# Patient Record
Sex: Male | Born: 1960 | Race: Asian | Marital: Married | Smoking: Never smoker
Health system: Southern US, Community
[De-identification: ages and names within clinical notes are randomized; demographics above are authoritative.]

---

## 2021-03-12 ENCOUNTER — Encounter (HOSPITAL_COMMUNITY): Payer: Self-pay | Admitting: Emergency Medicine

## 2021-03-12 ENCOUNTER — Inpatient Hospital Stay (HOSPITAL_COMMUNITY): Payer: Self-pay

## 2021-03-12 ENCOUNTER — Other Ambulatory Visit: Payer: Self-pay

## 2021-03-12 ENCOUNTER — Emergency Department (HOSPITAL_COMMUNITY): Payer: Self-pay

## 2021-03-12 ENCOUNTER — Inpatient Hospital Stay (HOSPITAL_COMMUNITY)
Admission: EM | Admit: 2021-03-12 | Discharge: 2021-03-26 | DRG: 023 | Disposition: E | Payer: Self-pay | Attending: Neurology | Admitting: Neurology

## 2021-03-12 DIAGNOSIS — I610 Nontraumatic intracerebral hemorrhage in hemisphere, subcortical: Secondary | ICD-10-CM | POA: Diagnosis present

## 2021-03-12 DIAGNOSIS — I615 Nontraumatic intracerebral hemorrhage, intraventricular: Principal | ICD-10-CM | POA: Diagnosis present

## 2021-03-12 DIAGNOSIS — Z7982 Long term (current) use of aspirin: Secondary | ICD-10-CM

## 2021-03-12 DIAGNOSIS — R0902 Hypoxemia: Secondary | ICD-10-CM

## 2021-03-12 DIAGNOSIS — R4701 Aphasia: Secondary | ICD-10-CM | POA: Diagnosis present

## 2021-03-12 DIAGNOSIS — G935 Compression of brain: Secondary | ICD-10-CM | POA: Diagnosis present

## 2021-03-12 DIAGNOSIS — I161 Hypertensive emergency: Secondary | ICD-10-CM | POA: Diagnosis present

## 2021-03-12 DIAGNOSIS — Z7902 Long term (current) use of antithrombotics/antiplatelets: Secondary | ICD-10-CM

## 2021-03-12 DIAGNOSIS — Z978 Presence of other specified devices: Secondary | ICD-10-CM

## 2021-03-12 DIAGNOSIS — H5500 Unspecified nystagmus: Secondary | ICD-10-CM | POA: Diagnosis present

## 2021-03-12 DIAGNOSIS — I639 Cerebral infarction, unspecified: Secondary | ICD-10-CM | POA: Diagnosis present

## 2021-03-12 DIAGNOSIS — I10 Essential (primary) hypertension: Secondary | ICD-10-CM | POA: Diagnosis present

## 2021-03-12 DIAGNOSIS — G9389 Other specified disorders of brain: Secondary | ICD-10-CM | POA: Diagnosis present

## 2021-03-12 DIAGNOSIS — G8191 Hemiplegia, unspecified affecting right dominant side: Secondary | ICD-10-CM | POA: Diagnosis present

## 2021-03-12 DIAGNOSIS — R9082 White matter disease, unspecified: Secondary | ICD-10-CM | POA: Diagnosis present

## 2021-03-12 DIAGNOSIS — E781 Pure hyperglyceridemia: Secondary | ICD-10-CM | POA: Diagnosis present

## 2021-03-12 DIAGNOSIS — D72829 Elevated white blood cell count, unspecified: Secondary | ICD-10-CM | POA: Diagnosis present

## 2021-03-12 DIAGNOSIS — R29810 Facial weakness: Secondary | ICD-10-CM | POA: Diagnosis present

## 2021-03-12 DIAGNOSIS — I629 Nontraumatic intracranial hemorrhage, unspecified: Secondary | ICD-10-CM

## 2021-03-12 DIAGNOSIS — Z9289 Personal history of other medical treatment: Secondary | ICD-10-CM

## 2021-03-12 DIAGNOSIS — E870 Hyperosmolality and hypernatremia: Secondary | ICD-10-CM | POA: Diagnosis not present

## 2021-03-12 DIAGNOSIS — J9601 Acute respiratory failure with hypoxia: Secondary | ICD-10-CM | POA: Diagnosis not present

## 2021-03-12 DIAGNOSIS — E785 Hyperlipidemia, unspecified: Secondary | ICD-10-CM | POA: Diagnosis present

## 2021-03-12 DIAGNOSIS — G936 Cerebral edema: Secondary | ICD-10-CM | POA: Diagnosis present

## 2021-03-12 DIAGNOSIS — Z4659 Encounter for fitting and adjustment of other gastrointestinal appliance and device: Secondary | ICD-10-CM

## 2021-03-12 LAB — I-STAT CHEM 8, ED
BUN: 12 mg/dL (ref 6–20)
Calcium, Ion: 1.05 mmol/L — ABNORMAL LOW (ref 1.15–1.40)
Chloride: 101 mmol/L (ref 98–111)
Creatinine, Ser: 0.9 mg/dL (ref 0.61–1.24)
Glucose, Bld: 121 mg/dL — ABNORMAL HIGH (ref 70–99)
HCT: 37 % — ABNORMAL LOW (ref 39.0–52.0)
Hemoglobin: 12.6 g/dL — ABNORMAL LOW (ref 13.0–17.0)
Potassium: 3.7 mmol/L (ref 3.5–5.1)
Sodium: 136 mmol/L (ref 135–145)
TCO2: 23 mmol/L (ref 22–32)

## 2021-03-12 LAB — APTT
aPTT: 33 seconds (ref 24–36)
aPTT: 30 seconds (ref 24–36)

## 2021-03-12 LAB — URINALYSIS, COMPLETE (UACMP) WITH MICROSCOPIC
Bacteria, UA: NONE SEEN
Bilirubin Urine: NEGATIVE
Glucose, UA: 150 mg/dL — AB
Hgb urine dipstick: NEGATIVE
Ketones, ur: 5 mg/dL — AB
Leukocytes,Ua: NEGATIVE
Nitrite: NEGATIVE
Protein, ur: NEGATIVE mg/dL
Specific Gravity, Urine: 1.008 (ref 1.005–1.030)
pH: 7 (ref 5.0–8.0)

## 2021-03-12 LAB — COMPREHENSIVE METABOLIC PANEL
ALT: 9 U/L (ref 0–44)
AST: 23 U/L (ref 15–41)
Albumin: 3.7 g/dL (ref 3.5–5.0)
Alkaline Phosphatase: 16 U/L — ABNORMAL LOW (ref 38–126)
Anion gap: 10 (ref 5–15)
BUN: 11 mg/dL (ref 6–20)
CO2: 21 mmol/L — ABNORMAL LOW (ref 22–32)
Calcium: 8.9 mg/dL (ref 8.9–10.3)
Chloride: 103 mmol/L (ref 98–111)
Creatinine, Ser: 0.93 mg/dL (ref 0.61–1.24)
GFR, Estimated: >60 mL/min (ref >60)
Glucose, Bld: 125 mg/dL — ABNORMAL HIGH (ref 70–99)
Potassium: 3.8 mmol/L (ref 3.5–5.1)
Sodium: 134 mmol/L — ABNORMAL LOW (ref 135–145)
Total Bilirubin: 0.7 mg/dL (ref 0.3–1.2)
Total Protein: 7.2 g/dL (ref 6.5–8.1)

## 2021-03-12 LAB — DIFFERENTIAL
Abs Immature Granulocytes: 0.03 10*3/uL (ref 0.00–0.07)
Basophils Absolute: 0.1 10*3/uL (ref 0.0–0.1)
Basophils Relative: 1 %
Eosinophils Absolute: 1.1 10*3/uL — ABNORMAL HIGH (ref 0.0–0.5)
Eosinophils Relative: 12 %
Immature Granulocytes: 0 %
Lymphocytes Relative: 31 %
Lymphs Abs: 2.8 10*3/uL (ref 0.7–4.0)
Monocytes Absolute: 0.8 10*3/uL (ref 0.1–1.0)
Monocytes Relative: 9 %
Neutro Abs: 4.3 10*3/uL (ref 1.7–7.7)
Neutrophils Relative %: 47 %

## 2021-03-12 LAB — CBC
HCT: 38.4 % — ABNORMAL LOW (ref 39.0–52.0)
HCT: 39 % (ref 39.0–52.0)
Hemoglobin: 12.4 g/dL — ABNORMAL LOW (ref 13.0–17.0)
Hemoglobin: 13 g/dL (ref 13.0–17.0)
MCH: 27.6 pg (ref 26.0–34.0)
MCH: 27.7 pg (ref 26.0–34.0)
MCHC: 32.3 g/dL (ref 30.0–36.0)
MCHC: 33.3 g/dL (ref 30.0–36.0)
MCV: 85.3 fL (ref 80.0–100.0)
MCV: 83.2 fL (ref 80.0–100.0)
Platelets: 389 10*3/uL (ref 150–400)
Platelets: 413 10*3/uL — ABNORMAL HIGH (ref 150–400)
RBC: 4.5 MIL/uL (ref 4.22–5.81)
RBC: 4.69 MIL/uL (ref 4.22–5.81)
RDW: 13.2 % (ref 11.5–15.5)
RDW: 13.2 % (ref 11.5–15.5)
WBC: 9 10*3/uL (ref 4.0–10.5)
WBC: 14.8 10*3/uL — ABNORMAL HIGH (ref 4.0–10.5)
nRBC: 0 % (ref 0.0–0.2)
nRBC: 0 % (ref 0.0–0.2)

## 2021-03-12 LAB — RAPID URINE DRUG SCREEN, HOSP PERFORMED
Amphetamines: NOT DETECTED
Barbiturates: NOT DETECTED
Benzodiazepines: NOT DETECTED
Cocaine: NOT DETECTED
Opiates: NOT DETECTED
Tetrahydrocannabinol: NOT DETECTED

## 2021-03-12 LAB — PROTIME-INR
INR: 1 (ref 0.8–1.2)
INR: 1 (ref 0.8–1.2)
Prothrombin Time: 13.6 seconds (ref 11.4–15.2)
Prothrombin Time: 13.7 seconds (ref 11.4–15.2)

## 2021-03-12 LAB — LIPID PANEL
Cholesterol: 127 mg/dL (ref 0–200)
HDL: 45 mg/dL (ref >40)
LDL Cholesterol: 54 mg/dL (ref 0–99)
Total CHOL/HDL Ratio: 2.8 RATIO
Triglycerides: 138 mg/dL (ref <150)
VLDL: 28 mg/dL (ref 0–40)

## 2021-03-12 LAB — SODIUM
Sodium: 134 mmol/L — ABNORMAL LOW (ref 135–145)
Sodium: 135 mmol/L (ref 135–145)

## 2021-03-12 LAB — HIV ANTIBODY (ROUTINE TESTING W REFLEX): HIV Screen 4th Generation wRfx: NONREACTIVE

## 2021-03-12 LAB — MRSA NEXT GEN BY PCR, NASAL: MRSA by PCR Next Gen: DETECTED — AB

## 2021-03-12 LAB — HEMOGLOBIN A1C
Hgb A1c MFr Bld: 6 % — ABNORMAL HIGH (ref 4.8–5.6)
Mean Plasma Glucose: 125.5 mg/dL

## 2021-03-12 LAB — CBG MONITORING, ED: Glucose-Capillary: 128 mg/dL — ABNORMAL HIGH (ref 70–99)

## 2021-03-12 LAB — ETHANOL: Alcohol, Ethyl (B): <10 mg/dL (ref <10)

## 2021-03-12 MED ORDER — LABETALOL HCL 5 MG/ML IV SOLN
20.0000 mg | Freq: Once | INTRAVENOUS | Status: AC
  Administered 2021-03-12: 20 mg via INTRAVENOUS

## 2021-03-12 MED ORDER — ACETAMINOPHEN 160 MG/5ML PO SOLN
650.0000 mg | ORAL | Status: DC | PRN
  Administered 2021-03-13 – 2021-03-15 (×6): 650 mg
  Filled 2021-03-12 (×7): qty 20.3

## 2021-03-12 MED ORDER — ORAL CARE MOUTH RINSE
15.0000 mL | Freq: Two times a day (BID) | OROMUCOSAL | Status: DC
  Administered 2021-03-13 – 2021-03-14 (×3): 15 mL via OROMUCOSAL

## 2021-03-12 MED ORDER — CHLORHEXIDINE GLUCONATE 0.12 % MT SOLN
15.0000 mL | Freq: Two times a day (BID) | OROMUCOSAL | Status: DC
  Administered 2021-03-12 – 2021-03-13 (×3): 15 mL via OROMUCOSAL
  Filled 2021-03-12: qty 15

## 2021-03-12 MED ORDER — ACETAMINOPHEN 650 MG RE SUPP
650.0000 mg | RECTAL | Status: DC | PRN
  Administered 2021-03-13: 650 mg via RECTAL
  Filled 2021-03-12 (×2): qty 1

## 2021-03-12 MED ORDER — ONDANSETRON HCL 4 MG/2ML IJ SOLN
4.0000 mg | Freq: Four times a day (QID) | INTRAMUSCULAR | Status: DC | PRN
  Administered 2021-03-12: 4 mg via INTRAVENOUS

## 2021-03-12 MED ORDER — SODIUM CHLORIDE 3 % IV SOLN
INTRAVENOUS | Status: DC
  Administered 2021-03-12: 50 mL/h via INTRAVENOUS
  Filled 2021-03-12 (×9): qty 500

## 2021-03-12 MED ORDER — LABETALOL HCL 5 MG/ML IV SOLN
INTRAVENOUS | Status: AC
  Filled 2021-03-12: qty 4

## 2021-03-12 MED ORDER — HYDRALAZINE HCL 20 MG/ML IJ SOLN: 10.0000 mg | Freq: Four times a day (QID) | INTRAMUSCULAR | Status: DC | PRN

## 2021-03-12 MED ORDER — LABETALOL HCL 5 MG/ML IV SOLN: 20.0000 mg | Freq: Once | INTRAVENOUS | Status: DC

## 2021-03-12 MED ORDER — SENNOSIDES-DOCUSATE SODIUM 8.6-50 MG PO TABS
1.0000 | ORAL_TABLET | Freq: Two times a day (BID) | ORAL | Status: DC
  Filled 2021-03-12: qty 1

## 2021-03-12 MED ORDER — CHLORHEXIDINE GLUCONATE CLOTH 2 % EX PADS
6.0000 | MEDICATED_PAD | Freq: Every day | CUTANEOUS | Status: DC
  Administered 2021-03-13 – 2021-03-15 (×3): 6 via TOPICAL

## 2021-03-12 MED ORDER — SODIUM CHLORIDE 0.9 % IV SOLN: 12.5000 mg | Freq: Four times a day (QID) | INTRAVENOUS | Status: DC | PRN

## 2021-03-12 MED ORDER — MUPIROCIN 2 % EX OINT
1.0000 "application " | TOPICAL_OINTMENT | Freq: Two times a day (BID) | CUTANEOUS | Status: DC
  Administered 2021-03-12 – 2021-03-15 (×5): 1 via NASAL
  Filled 2021-03-12: qty 22

## 2021-03-12 MED ORDER — ONDANSETRON HCL 4 MG/2ML IJ SOLN
INTRAMUSCULAR | Status: AC
  Filled 2021-03-12: qty 2

## 2021-03-12 MED ORDER — LABETALOL HCL 5 MG/ML IV SOLN
10.0000 mg | Freq: Once | INTRAVENOUS | Status: AC
  Administered 2021-03-12: 10 mg via INTRAVENOUS
  Filled 2021-03-12: qty 4

## 2021-03-12 MED ORDER — CHLORHEXIDINE GLUCONATE CLOTH 2 % EX PADS
6.0000 | MEDICATED_PAD | Freq: Every day | CUTANEOUS | Status: DC
  Administered 2021-03-12: 6 via TOPICAL

## 2021-03-12 MED ORDER — PANTOPRAZOLE SODIUM 40 MG IV SOLR
40.0000 mg | Freq: Every day | INTRAVENOUS | Status: DC
Start: 2021-03-12 — End: 2021-03-15
  Administered 2021-03-12 – 2021-03-14 (×3): 40 mg via INTRAVENOUS
  Filled 2021-03-12 (×3): qty 40

## 2021-03-12 MED ORDER — STROKE: EARLY STAGES OF RECOVERY BOOK: Freq: Once | Status: DC

## 2021-03-12 MED ORDER — CLEVIDIPINE BUTYRATE 0.5 MG/ML IV EMUL: 0.0000 mg/h | INTRAVENOUS | Status: DC

## 2021-03-12 MED ORDER — CLEVIDIPINE BUTYRATE 0.5 MG/ML IV EMUL
0.0000 mg/h | INTRAVENOUS | Status: DC
  Administered 2021-03-12: 14 mg/h via INTRAVENOUS
  Administered 2021-03-12: 30 mg/h via INTRAVENOUS
  Administered 2021-03-12: 22 mg/h via INTRAVENOUS
  Administered 2021-03-12: 28 mg/h via INTRAVENOUS
  Administered 2021-03-12: 1 mg/h via INTRAVENOUS
  Administered 2021-03-12: 12 mg/h via INTRAVENOUS
  Administered 2021-03-13: 20 mg/h via INTRAVENOUS
  Administered 2021-03-13 (×2): 18 mg/h via INTRAVENOUS
  Administered 2021-03-13: 14 mg/h via INTRAVENOUS
  Administered 2021-03-13: 10 mg/h via INTRAVENOUS
  Administered 2021-03-14 (×2): 28 mg/h via INTRAVENOUS
  Administered 2021-03-14 (×3): 32 mg/h via INTRAVENOUS
  Administered 2021-03-14: 16 mg/h via INTRAVENOUS
  Administered 2021-03-14: 18 mg/h via INTRAVENOUS
  Administered 2021-03-14: 26 mg/h via INTRAVENOUS
  Administered 2021-03-15: 32 mg/h via INTRAVENOUS
  Administered 2021-03-15: 28 mg/h via INTRAVENOUS
  Administered 2021-03-15 (×5): 32 mg/h via INTRAVENOUS
  Filled 2021-03-12: qty 50
  Filled 2021-03-12: qty 100
  Filled 2021-03-12 (×2): qty 50
  Filled 2021-03-12 (×2): qty 100
  Filled 2021-03-12: qty 50
  Filled 2021-03-12 (×2): qty 100
  Filled 2021-03-12: qty 200
  Filled 2021-03-12: qty 100
  Filled 2021-03-12: qty 50
  Filled 2021-03-12: qty 100
  Filled 2021-03-12: qty 300
  Filled 2021-03-12: qty 200
  Filled 2021-03-12: qty 50
  Filled 2021-03-12: qty 200
  Filled 2021-03-12 (×3): qty 100
  Filled 2021-03-12: qty 300
  Filled 2021-03-12: qty 100

## 2021-03-12 MED ORDER — ACETAMINOPHEN 325 MG PO TABS: 650.0000 mg | ORAL_TABLET | ORAL | Status: DC | PRN

## 2021-03-12 NOTE — Consult Note (Addendum)
NAME:  Brendan Burgess, MRN:  [id number], DOB:  [birth year], LOS: 0 ADMISSION DATE:  03/13/2021, CONSULTATION DATE: 03/13/2021 REFERRING MD: Dr. Arbutus Ped, CHIEF COMPLAINT:  Basal ganglia hemorrhagic stroke    History of Present Illness:  Brendan Burgess is a 60 y.o. male with a PMH significant for prior CVA on ASA and HTN who presented to the ED for code stroke due to acute onset aphasia, left gaze, and right sided weakness with facial droop. LKN 0915.   On ED arrival patient was taken emergently to CT where a 4.7 x 6.1 x 2.4cm left basal ganglia haemorrhagia was seen. Since admission (once in ED and once in ICU) patient vomited. Concern for airway protection prompted PCCM consult for further assistance in management.   Pertinent  Medical History  Prior CVA on ASA HTN   Significant Hospital Events:   10/18 admitted as code stroke with acute onset aphasia, left gaze, and right sided weakness with facial droop. LKN 0915. Vomited twice PCCM consulted for possible need of airway management   Interim History / Subjective:  As above   Objective   Blood pressure (!) 153/94, pulse 72, temperature (!) 96.6 F (35.9 C), temperature source Axillary, resp. rate 17, weight 63.8 kg, SpO2 98 %.        Intake/Output Summary (Last 24 hours) at 03/17/2021 1319 Last data filed at 03/17/2021 1226 Gross per 24 hour  Intake 15.62 ml  Output --  Net 15.62 ml   Filed Weights   03/11/2021 1100  Weight: 63.8 kg    Examination: General: Well developed middle aged male lying in be in NAD HEENT: [place]/AT, MM pink/moist, PERRL,  Neuro: Unresponsive with left gaze  CV: s1s2 regular rate and rhythm, no murmur, rubs, or gallops,  PULM:  Snoring respirations that resolve with chin thrust, oxygen saturations 94-98 on RA GI: soft, bowel sounds active in all 4 quadrants, non-tender, non-distended Extremities: warm/dry, no edema  Skin: no rashes or lesions  Resolved Hospital Problem list     Assessment & Plan:  Large  left basal ganglia hemorrhage with associated mass effect -CT on admit 10/18 revealed a 4.7 x 6.1 x 2.4cm left basal ganglia haemorrhagia was seen.  P: Close monitoring in the ICU Primary management per neurology  Maintain neuro protective measures; goal for eurothermia, euglycemia, eunatermia, normoxia, and PCO2 goal of 35-40 Nutrition and bowel regiment  Seizure precautions  Aspirations precautions  Further imagine per neurro  Hypertonic saline   Hypertensive emergency  -BP 174/133 on admit  Hx of HTN  -Working with family to determine home medication P: Continue Cleviprex for SBP less than 140 Continuous telemetry  PRN IV Hydralazine    At risk for respiratory compromise  -Given decreased mentation in the setting of ICH patient is at risk for inability to protect his airway.  P: Close monitoring of airway HOB elevated  Oral and or NTS suctioning as needed  Minimize sedation  Ensure adequate pulmonary hygiene    Best Practice    Diet/type: NPO DVT prophylaxis: SCD GI prophylaxis: PPI Lines: N/A Foley:  N/A Code Status:  full code Last date of multidisciplinary goals of care discussion: Pending   Labs   CBC: Recent Labs  Lab 03/04/2021 1125 03/20/2021 1131  WBC 9.0  --   NEUTROABS 4.3  --   HGB 12.4* 12.6*  HCT 38.4* 37.0*  MCV 85.3  --   PLT 389  --     Basic Metabolic Panel: Recent Labs  Lab 03/19/2021  1125 04-08-21 1131  NA 134* 136  K 3.8 3.7  CL 103 101  CO2 21*  --   GLUCOSE 125* 121*  BUN 11 12  CREATININE 0.93 0.90  CALCIUM 8.9  --    GFR: CrCl cannot be calculated (Unknown ideal weight.). Recent Labs  Lab 04/08/2021 1125  WBC 9.0    Liver Function Tests: Recent Labs  Lab April 08, 2021 1125  AST 23  ALT 9  ALKPHOS 16*  BILITOT 0.7  PROT 7.2  ALBUMIN 3.7   No results for input(s): LIPASE, AMYLASE in the last 168 hours. No results for input(s): AMMONIA in the last 168 hours.  ABG    Component Value Date/Time   TCO2 23 04-08-2021  1131     Coagulation Profile: Recent Labs  Lab Apr 08, 2021 1125  INR 1.0    Cardiac Enzymes: No results for input(s): CKTOTAL, CKMB, CKMBINDEX, TROPONINI in the last 168 hours.  HbA1C: No results found for: HGBA1C  CBG: Recent Labs  Lab 04-08-21 1124  GLUCAP 128*    Review of Systems:   Unable to assess   Past Medical History:  He,  has no past medical history on file.   Surgical History:  History reviewed. No pertinent surgical history.   Social History:      Family History:  His family history is not on file.   Allergies Not on File   Home Medications  Prior to Admission medications   Not on File     Critical care time: NA  Giles Currie D. Tiburcio Pea, NP-C [facility] Personal contact information can be found on Amion  04-08-21, 1:49 PM

## 2021-03-12 NOTE — Progress Notes (Addendum)
1300:  Patient transferred from ED to 4NICU.  Initial BP above blood pressure goal.  Cleviprex titrated to max and PRN given. Patient had large vomit.  Neurology MD paged and made aware of blood pressure and vomit.  New orders received.  CCM consulted per neurology and is at bedside assessing patient.    1500:  CT completed.  CCM and Neurology MD aware.

## 2021-03-12 NOTE — ED Triage Notes (Signed)
Pt arrived by EMS. Code stroke was called Pt is vietnamese speaking, translator used for intal interview  Pt aphasic and not following commands, left gaze.

## 2021-03-12 NOTE — Code Documentation (Signed)
Stroke Response Nurse Documentation Code Documentation  Brendan Burgess is a 60 y.o. male arriving to [facility] ED via [facility] on 03/09/2021 with past medical hx of CVA (11 years ago in [place]), HTN. On aspirin 81 mg daily. Code stroke was activated by [facility].   Patient from home where he was LKW at 0915 and now complaining of aphasia, left gaze, right sided weakness, facial droop. [name] brought pt and his wife home from errands at 45 and pt was normal in his independent state. [name] called son back to house when pt was not responding to her. EMS called.  Stroke team at the bedside on patient arrival. Labs drawn and patient cleared for CT by EDP. Patient to CT with team. NIHSS 31, see documentation for details and code stroke times. Patient with decreased LOC, disoriented, not following commands, left gaze preference , right hemianopia, left facial droop, right arm weakness, bilateral leg weakness, bilateral decreased sensation, Global aphasia , dysarthria , and Visual  neglect on exam. The following imaging was completed:  CT. Patient is not a candidate for IV Thrombolytic due to ICH seen on exam per MD. Patient is not a candidate for IR due to no LVO. Labetalol given twice and then Cleviprex gtt started. Pt vomited once in ED and then again in ICU. PRNs ordered and given. Neurologist consulted CCM for medical management of airway in case of decline.   Care/Plan: Q1 VS/mNIHSS/pupils. BP<140, hypertonic saline.   Bedside handoff with ED RN Annice Pih and then TK on 4N.    Demaris Leavell, [place]  Stroke Response RN

## 2021-03-12 NOTE — Progress Notes (Signed)
60 yo with left basal ganglia hypertensive hemorrhage.  No indication for neurosurgical intervention.  Rec admit to neurology/CCM for supportive care

## 2021-03-12 NOTE — ED Provider Notes (Addendum)
[facility] EMERGENCY DEPARTMENT Provider Note   CSN: [id number] Arrival date & time: 02/23/2021  1123  An emergency department physician performed an initial assessment on this suspected stroke patient at 1121.  History Chief Complaint  Patient presents with   Code Stroke    Brendan Burgess is a 60 y.o. male   Level 5 caveat altered mental status.  All history obtained from EMS personnel.  60 year old male history of hypertension otherwise healthy last known well around 9:15 AM today.  Patient was dropped off at his house by his son, received a call a few minutes later that the patient was acting abnormally.  EMS found patient at home with aphasia, leftward gaze preference and right hemiplegia.  Code stroke was activated patient was met at the bridge by ER physician as well as neurology, Dr. Cheral Marker.  He was taken directly to the CT scanner and found to have intracranial hemorrhage.  Patient was noted to be hypertensive, labetalol as well as hypertonic saline has been ordered by neurology.   HPI     History reviewed. No pertinent past medical history.  Patient Active Problem List   Diagnosis Date Noted   Stroke (cerebrum) ([place]) 03/05/2021    History reviewed. No pertinent surgical history.     History reviewed. No pertinent family history.     Home Medications Prior to Admission medications   Not on File    Allergies    Patient has no allergy information on record.  Review of Systems   Review of Systems  Unable to perform ROS: Mental status change   Physical Exam Updated Vital Signs BP 117/76   Pulse 68   Temp (!) 96.6 F (35.9 C) (Axillary)   Resp (!) 21   Wt 63.8 kg   SpO2 93%   Physical Exam Constitutional:      Appearance: He is well-developed and normal weight. He is not toxic-appearing.  HENT:     Head: Normocephalic. No raccoon eyes or Battle's sign.  Eyes:     General: Lids are normal.     Conjunctiva/sclera: Conjunctivae normal.      Comments: Leftward gaze  Neck:     Trachea: Trachea normal.  Pulmonary:     Effort: Pulmonary effort is normal. No accessory muscle usage or respiratory distress.     Breath sounds: Normal air entry.  Abdominal:     General: Abdomen is flat.     Palpations: Abdomen is soft.  Musculoskeletal:     Cervical back: Neck supple.  Skin:    General: Skin is warm and dry.  Neurological:     Mental Status: He is lethargic.  Psychiatric:        Speech: He is noncommunicative.    ED Results / Procedures / Treatments   Labs (all labs ordered are listed, but only abnormal results are displayed) Labs Reviewed  CBC - Abnormal; Notable for the following components:      Result Value   Hemoglobin 12.4 (*)    HCT 38.4 (*)    All other components within normal limits  DIFFERENTIAL - Abnormal; Notable for the following components:   Eosinophils Absolute 1.1 (*)    All other components within normal limits  COMPREHENSIVE METABOLIC PANEL - Abnormal; Notable for the following components:   Sodium 134 (*)    CO2 21 (*)    Glucose, Bld 125 (*)    Alkaline Phosphatase 16 (*)    All other components within normal limits  I-STAT  CHEM 8, ED - Abnormal; Notable for the following components:   Glucose, Bld 121 (*)    Calcium, Ion 1.05 (*)    Hemoglobin 12.6 (*)    HCT 37.0 (*)    All other components within normal limits  CBG MONITORING, ED - Abnormal; Notable for the following components:   Glucose-Capillary 128 (*)    All other components within normal limits  MRSA NEXT GEN BY PCR, NASAL  PROTIME-INR  APTT  HIV ANTIBODY (ROUTINE TESTING W REFLEX)  CBC  PROTIME-INR  APTT  LIPID PANEL  ETHANOL  HEMOGLOBIN A1C  URINALYSIS, COMPLETE (UACMP) WITH MICROSCOPIC  RAPID URINE DRUG SCREEN, HOSP PERFORMED  SODIUM  SODIUM  SODIUM    EKG None  Radiology CT HEAD CODE STROKE WO CONTRAST  Result Date: 03/22/2021 CLINICAL DATA:  Code stroke.  Left-sided gaze. EXAM: CT HEAD WITHOUT CONTRAST  TECHNIQUE: Contiguous axial images were obtained from the base of the skull through the vertex without intravenous contrast. COMPARISON:  None. FINDINGS: Brain: A left basal ganglia and external capsule hemorrhage measures 4.7 x 6.1 x 2.4 cm. Minimal amount subarachnoid hemorrhage is present on the left. No definite intraventricular hemorrhage is present. No hydrocephalus is present. 3 mm of midline shift is present. Mass effect effaces the left lateral ventricle. There is effacement of the sulci over the left convexity. Moderate diffuse white matter disease is present. Remote nonhemorrhagic lacunar infarcts are present in the right thalamus. Brainstem and cerebellum are within normal limits. Vascular: Atherosclerotic calcifications are present within the cavernous internal carotid arteries. No hyperdense vessel is present. Skull: Calvarium is intact. No focal lytic or blastic lesions are present. Left supraorbital scalp soft tissue swelling is present without underlying fracture. Sinuses/Orbits: The paranasal sinuses and mastoid air cells are clear. Bilateral lens replacements are noted. Globes and orbits are otherwise unremarkable. IMPRESSION: 1. 4.7 x 6.1 x 2.4 cm left basal ganglia and external capsule hemorrhage. 2. Minimal amount of subarachnoid hemorrhage on the left. 3. Mass effect effaces the left lateral ventricle with 3 mm of midline shift. 4. Left supraorbital scalp soft tissue swelling without underlying fracture. 5. Moderate diffuse white matter disease likely reflects the sequela of chronic microvascular ischemia. The above was relayed via text pager to Dr. Cheral Marker on 03/10/2021 at 11:36 . Electronically Signed   By: San Morelle M.D.   On: 03/08/2021 11:41    Procedures .Critical Care Performed by: Deliah Boston, PA-C Authorized by: Deliah Boston, PA-C   Critical care provider statement:    Critical care time (minutes):  31   Critical care was necessary to treat or prevent  imminent or life-threatening deterioration of the following conditions:  CNS failure or compromise   Critical care was time spent personally by me on the following activities:  Development of treatment plan with patient or surrogate, discussions with consultants, examination of patient, obtaining history from patient or surrogate, review of old charts, pulse oximetry, re-evaluation of patient's condition, ordering and review of radiographic studies and ordering and review of laboratory studies   Medications Ordered in ED Medications  clevidipine (CLEVIPREX) infusion 0.5 mg/mL (30 mg/hr Intravenous New Bag/Given 03/10/2021 1248)  ondansetron (ZOFRAN) injection 4 mg (4 mg Intravenous Given 03/23/2021 1157)  ondansetron (ZOFRAN) 4 MG/2ML injection (  Not Given 03/25/2021 1200)   stroke: mapping our early stages of recovery book (has no administration in time range)  acetaminophen (TYLENOL) tablet 650 mg (has no administration in time range)    Or  acetaminophen (  TYLENOL) 160 MG/5ML solution 650 mg (has no administration in time range)    Or  acetaminophen (TYLENOL) suppository 650 mg (has no administration in time range)  senna-docusate (Senokot-S) tablet 1 tablet (has no administration in time range)  pantoprazole (PROTONIX) injection 40 mg (has no administration in time range)  sodium chloride (hypertonic) 3 % solution (50 mL/hr Intravenous New Bag/Given 03/13/2021 1226)  labetalol (NORMODYNE) 5 MG/ML injection (has no administration in time range)  promethazine (PHENERGAN) 12.5 mg in sodium chloride 0.9 % 50 mL IVPB (has no administration in time range)  Chlorhexidine Gluconate Cloth 2 % PADS 6 each (has no administration in time range)  labetalol (NORMODYNE) injection 20 mg (20 mg Intravenous Given 03/10/2021 1135)  labetalol (NORMODYNE) injection 10 mg (10 mg Intravenous Given 03/09/2021 1301)    ED Course  I have reviewed the triage vital signs and the nursing notes.  Pertinent labs & imaging  results that were available during my care of the patient were reviewed by me and considered in my medical decision making (see chart for details).  Clinical Course as of 03/25/2021 1359  Tue Mar 12, 2021  1157 Neurosurgery Repaged [BM]    Clinical Course User Index [BM] Gari Crown   MDM Rules/Calculators/A&P                           Additional history obtained from: Nursing notes from this visit. Review of electronic medical records.  60 year old male history of hypertension otherwise healthy last known well around 9:15 AM today.  Patient was dropped off at his house by his son, received a call a few minutes later that the patient was acting abnormally.  EMS found patient at home with aphasia, leftward gaze preference and right hemiplegia.  Code stroke was activated patient was met at the bridge by ER physician as well as neurology, Dr. Cheral Marker.  He was taken directly to the CT scanner and found to have intracranial hemorrhage.  Patient was noted to be hypertensive, labetalol as well as hypertonic saline has been ordered by neurology.  I arrived to CT, Dr. Cheral Marker asked that I page neurosurgery for their input. On initial evaluation patient on CT table, airway intact with leftward gaze. ------ 11:57 AM: Received call from neurosurgery team spoke with RN who reported she was sitting with Dr. Trenton Gammon.  RN reports that Dr. Trenton Gammon reviewed images and this is not interpretable and they recommend neurology admission. ------ Patient arrived to room 25, stroke team RNs at bedside, I updated them on neurosurgery recommendations.  Cleviprex infusion being managed by stroke team. Awaiting further neurology recommendations.  On reevaluation vital signs are stable patient's airway is intact.  Stroke team RN reports patient had emesis earlier for which they gave Zofran. Patient's family at bedside. ------ Labs resulted, CMP without emergent electrolyte derangement, AKI or gap.  INR within normal  limits.  CBC shows mild anemia of 12.4, no leukocytosis or thrombocytopenia.  Glucose of 128.  I returned to room to reassess patient and update family, patient has been taken to the floor by neurology.  Note: Portions of this report may have been transcribed using voice recognition software. Every effort was made to ensure accuracy; however, inadvertent computerized transcription errors may still be present.  Final Clinical Impression(s) / ED Diagnoses Final diagnoses:  Intracranial hemorrhage ([place])    Rx / DC Orders ED Discharge Orders     None  Deliah Boston, PA-C 03/13/2021 1334    Deliah Boston, PA-C 03/18/2021 1335    Deliah Boston, PA-C 03/08/2021 [place], DO 03/13/21 [phone]

## 2021-03-12 NOTE — H&P (Addendum)
Admission H&P    Chief Complaint: Acute onset of right sided weakness  HPI: Brendan Burgess is an 60 y.o. Vietnamese-speaking male with a PMHx of HTN and stroke approximately 11 years ago while in [place] (had left sided weakness and required PT), presenting acutely from home via EMS as a Code Stroke after family noted him to be acutely nonverbal with right sided weakness. LKN was 0915 when the patient was dropped off at home by his son. Later this morning, he was with his wife when she noted him to acutely become aphasic with right sided weakness. EMS was called and on arrival to the patient's home, he continued to be weak on his right, with right facial droop, left gaze deviation and aphasia.   Of note, the patient recently arrived in the [place] from [place]. He   LSN: [id number]  PMHx Stroke approximately 11 years ago with left sided weakness HTN  History reviewed. No pertinent surgical history.  History reviewed. No pertinent family history. Social History:  has no history on file for tobacco use, alcohol use, and drug use.  Allergies: Not on File  Medications: None listed in Epic Family states he takes ASA daily On one or more BP medications Home Rx's per family are in [place] brought over from [place]  ROS: Unable to obtain due to AMS.   Physical Examination: Weight 63.8 kg.  HEENT-  [place]/AT  Lungs - Respirations unlabored Extremities - No edema  Neurologic Examination: Ment: Exam assisted by [place] interpreter. Awake with decreased level of alertness. Right hemineglect. Nonverbal to all questions given via [place] interpreter. Not following any commands, except for lifting of LUE and attempt to smile, which were performed after a significant delay.  CN: PERRL. No blink to threat. Eyes deviated to the left; cannot overcome with oculocephalic maneuver. Decreased responses to right sided stimuli. Right facial droop. Nonverbal. Head rotated preferentially to the left. Does not  protrude tongue to command.  Motor/Sensory: Moves LUE spontaneously and also will elevate antigravity to command, after a significant delay.  Withdraws LLE to noxious.  RUE: Flaccid tone with no movement to noxious.  RLE: Minimal movement to noxious.  Reflexes: 2+ bilateral brachioradialis and patellae. Right toe mute, left toe upgoing.  Cerebellar: Not following commands for testing.  Gait: Unable to assess  Results for orders placed or performed during the hospital encounter of 03/01/2021 (from the past 48 hour(s))  CBG monitoring, ED     Status: Abnormal   Collection Time: 03/23/2021 11:24 AM  Result Value Ref Range   Glucose-Capillary 128 (H) 70 - 99 mg/dL    Comment: Glucose reference range applies only to samples taken after fasting for at least 8 hours.  I-stat chem 8, ED     Status: Abnormal   Collection Time: 03/11/2021 11:31 AM  Result Value Ref Range   Sodium 136 135 - 145 mmol/L   Potassium 3.7 3.5 - 5.1 mmol/L   Chloride 101 98 - 111 mmol/L   BUN 12 6 - 20 mg/dL   Creatinine, Ser 1.09 0.61 - 1.24 mg/dL   Glucose, Bld 323 (H) 70 - 99 mg/dL    Comment: Glucose reference range applies only to samples taken after fasting for at least 8 hours.   Calcium, Ion 1.05 (L) 1.15 - 1.40 mmol/L   TCO2 23 22 - 32 mmol/L   Hemoglobin 12.6 (L) 13.0 - 17.0 g/dL   HCT 55.7 (L) 32.2 - 02.5 %   No results found.  Assessment: 60  y.o. male with a prior history of stroke, presenting with acute onset of right hemiplegia, right facial droop, aphasia and leftward gaze deviation. CT head reveals a large left basal ganglia hemorrhage with associated mass effect.  1. Exam findings best localize to the left cerebral hemisphere.  2. CT head: 4.7 x 6.1 x 2.4 cm left basal ganglia and external capsule hemorrhage. Minimal amount of subarachnoid hemorrhage on the left. Mass effect effaces the left lateral ventricle with 3 mm of midline shift. Left supraorbital scalp soft tissue swelling without underlying  fracture. Moderate diffuse white matter disease likely reflects the sequela of chronic microvascular ischemia. 3. Not on a blood thinner at home, per family. Takes daily ASA.    Plan: 1. Admit to ICU under Neurology service 2. MRI/MRA of head 3. Carotid ultrasound 4. TTE 5. PT consult, OT consult, Speech consult 6. Cardiac telemetry 7. Frequent neuro checks 8. Hypertonic saline 3% at 50 cc/hr 9. BP management with clevidipine drip. SBP goal of < 140 10. No antiplatelet medications or anticoagulants 11. Repeat CT head in 6 hours (ordered).  12. Family to bring in the patient's home medications. Instructed to call Neurology when the medications are available for documenting.  13. Neurosurgery has been consulted. At this point in time, they do not feel that the patient is a surgical candidate.   50 minutes spent in the emergent neurological evaluation and management of this critically ill patient.   Addendum: - Repeat CT head shows enlarging hematoma, now with extension into the left lateral ventricle, as well as increased mass effect and left to right midline shift - Neurosurgery has been called back by CCM. Per CCM, Neurosurgery has viewed the second CT scan and does not feel that surgical intervention benefits would outweigh risks - Continuing hypertonic saline - Repeat CT head in 6 hours.  - Neurology team to discuss goals of care and code status with the family.   Electronically signed: Dr. Caryl Pina 03/22/2021, 11:35 AM

## 2021-03-13 ENCOUNTER — Inpatient Hospital Stay (HOSPITAL_COMMUNITY): Payer: Self-pay

## 2021-03-13 DIAGNOSIS — Z8679 Personal history of other diseases of the circulatory system: Secondary | ICD-10-CM

## 2021-03-13 DIAGNOSIS — G936 Cerebral edema: Secondary | ICD-10-CM

## 2021-03-13 DIAGNOSIS — I161 Hypertensive emergency: Secondary | ICD-10-CM

## 2021-03-13 DIAGNOSIS — I6389 Other cerebral infarction: Secondary | ICD-10-CM

## 2021-03-13 DIAGNOSIS — D72829 Elevated white blood cell count, unspecified: Secondary | ICD-10-CM

## 2021-03-13 LAB — GLUCOSE, CAPILLARY
Glucose-Capillary: 125 mg/dL — ABNORMAL HIGH (ref 70–99)
Glucose-Capillary: 168 mg/dL — ABNORMAL HIGH (ref 70–99)
Glucose-Capillary: 168 mg/dL — ABNORMAL HIGH (ref 70–99)

## 2021-03-13 LAB — CBC
HCT: 43.5 % (ref 39.0–52.0)
Hemoglobin: 13.8 g/dL (ref 13.0–17.0)
MCH: 28.2 pg (ref 26.0–34.0)
MCHC: 31.7 g/dL (ref 30.0–36.0)
MCV: 89 fL (ref 80.0–100.0)
Platelets: 422 10*3/uL — ABNORMAL HIGH (ref 150–400)
RBC: 4.89 MIL/uL (ref 4.22–5.81)
RDW: 13.7 % (ref 11.5–15.5)
WBC: 9.6 10*3/uL (ref 4.0–10.5)
nRBC: 0 % (ref 0.0–0.2)

## 2021-03-13 LAB — MAGNESIUM
Magnesium: 2.1 mg/dL (ref 1.7–2.4)
Magnesium: 2.2 mg/dL (ref 1.7–2.4)

## 2021-03-13 LAB — BASIC METABOLIC PANEL
Anion gap: 12 (ref 5–15)
BUN: 8 mg/dL (ref 6–20)
CO2: 16 mmol/L — ABNORMAL LOW (ref 22–32)
Calcium: 8.7 mg/dL — ABNORMAL LOW (ref 8.9–10.3)
Chloride: 113 mmol/L — ABNORMAL HIGH (ref 98–111)
Creatinine, Ser: 0.83 mg/dL (ref 0.61–1.24)
GFR, Estimated: >60 mL/min (ref >60)
Glucose, Bld: 132 mg/dL — ABNORMAL HIGH (ref 70–99)
Potassium: 4.8 mmol/L (ref 3.5–5.1)
Sodium: 141 mmol/L (ref 135–145)

## 2021-03-13 LAB — PHOSPHORUS: Phosphorus: 2.5 mg/dL (ref 2.5–4.6)

## 2021-03-13 LAB — ECHOCARDIOGRAM COMPLETE
Area-P 1/2: 6.83 cm2
Calc EF: 62 %
S' Lateral: 2.8 cm
Single Plane A2C EF: 64.5 %
Single Plane A4C EF: 61.9 %
Weight: 2250.46 oz

## 2021-03-13 LAB — SODIUM
Sodium: 138 mmol/L (ref 135–145)
Sodium: 147 mmol/L — ABNORMAL HIGH (ref 135–145)

## 2021-03-13 MED ORDER — LABETALOL HCL 5 MG/ML IV SOLN
5.0000 mg | INTRAVENOUS | Status: DC | PRN
  Administered 2021-03-13 (×2): 20 mg via INTRAVENOUS
  Administered 2021-03-14: 10 mg via INTRAVENOUS
  Administered 2021-03-14: 20 mg via INTRAVENOUS
  Administered 2021-03-14: 10 mg via INTRAVENOUS
  Administered 2021-03-14 – 2021-03-15 (×2): 20 mg via INTRAVENOUS
  Filled 2021-03-13 (×9): qty 4

## 2021-03-13 MED ORDER — FENTANYL CITRATE PF 50 MCG/ML IJ SOSY
50.0000 ug | PREFILLED_SYRINGE | Freq: Once | INTRAMUSCULAR | Status: AC
  Administered 2021-03-13: 50 ug via INTRAVENOUS
  Filled 2021-03-13: qty 1

## 2021-03-13 MED ORDER — OSMOLITE 1.2 CAL PO LIQD
1000.0000 mL | ORAL | Status: DC
  Administered 2021-03-13 – 2021-03-15 (×4): 1000 mL

## 2021-03-13 MED ORDER — INSULIN ASPART 100 UNIT/ML IJ SOLN
0.0000 [IU] | INTRAMUSCULAR | Status: DC
  Administered 2021-03-13 – 2021-03-14 (×3): 3 [IU] via SUBCUTANEOUS
  Administered 2021-03-14: 2 [IU] via SUBCUTANEOUS
  Administered 2021-03-14: 3 [IU] via SUBCUTANEOUS
  Administered 2021-03-14 – 2021-03-15 (×2): 5 [IU] via SUBCUTANEOUS
  Administered 2021-03-15 (×2): 3 [IU] via SUBCUTANEOUS
  Administered 2021-03-15: 5 [IU] via SUBCUTANEOUS
  Administered 2021-03-15: 3 [IU] via SUBCUTANEOUS

## 2021-03-13 MED ORDER — HYDRALAZINE HCL 20 MG/ML IJ SOLN
10.0000 mg | Freq: Four times a day (QID) | INTRAMUSCULAR | Status: DC | PRN
  Administered 2021-03-13 – 2021-03-15 (×3): 10 mg via INTRAVENOUS
  Filled 2021-03-13 (×3): qty 1

## 2021-03-13 MED ORDER — PROSOURCE TF PO LIQD
45.0000 mL | Freq: Every day | ORAL | Status: DC
  Administered 2021-03-13 – 2021-03-15 (×2): 45 mL
  Filled 2021-03-13: qty 45

## 2021-03-13 MED ORDER — SENNOSIDES-DOCUSATE SODIUM 8.6-50 MG PO TABS
1.0000 | ORAL_TABLET | Freq: Two times a day (BID) | ORAL | Status: DC
  Administered 2021-03-13 – 2021-03-15 (×3): 1
  Filled 2021-03-13 (×3): qty 1

## 2021-03-13 MED ORDER — SODIUM CHLORIDE 23.4 % INJECTION (4 MEQ/ML) FOR IV ADMINISTRATION
120.0000 meq | Freq: Once | INTRAVENOUS | Status: DC
  Filled 2021-03-13: qty 30

## 2021-03-13 NOTE — Consult Note (Signed)
Neurosurgery Consultation  Reason for Consult: ICH Referring Physician: Roda Shutters  CC: AMS  HPI: This is a 60 y.o. man w/ h/o remote R ICH 11y ago that reportedly returned to baseline, now presents with acute right sided weakness and aphasia with altered mental status and was found to have a large left sided ICH. It has unfortunately enlarged in size on multiple scans with slowly worsening mental status. Further hx unavailable from pt due to altered mental status / aphasia.    ROS: A 14 point ROS was performed and is negative except as noted in the HPI.   PMHx: History reviewed. No pertinent past medical history. FamHx: History reviewed. No pertinent family history. SocHx:  reports that he has never smoked. He has never used smokeless tobacco. He reports that he does not currently use alcohol. He reports that he does not currently use drugs.  Exam: Vital signs in last 24 hours: Temp:  [97.7 F (36.5 C)-100.4 F (38 C)] 100.4 F (38 C) (10/19 1600) Pulse Rate:  [75-105] 97 (10/19 1800) Resp:  [13-25] 19 (10/19 1800) BP: (91-173)/(63-95) 142/80 (10/19 1800) SpO2:  [96 %-100 %] 99 % (10/19 1800) General: Awake, alert, cooperative, lying in hospital bed, appears acutely ill Head: Normocephalic and atruamatic HEENT: Neck supple Pulmonary: breathing room air comfortably, no evidence of increased work of breathing Cardiac: RRR Abdomen: S NT ND Extremities: Warm and well perfused x4 Neuro: Eyes open to stim, PERRL, gaze conjugate with L gaze deviation, globally aphasic w/ R hemineglect, LUE moves to stimulus in a purposeful manner and w/ LLE, R side flaccid  Assessment and Plan: 60 y.o. man w/ large L BG ICH, of roughly 60cc. CTH personally reviewed, which shows L BG ICH, 8.27mm of midline shift, small area of IVH. On MRI, likely R hydro ex vacuo w/ some R BG hemosiderin on MRI, 9.30mm of midline shift at the mid-septum.   -discussed w/ the pt's son at length regarding minimally invasive ICH  evacuation including risks and potential benefits. Given his centimeter of midline shift, despite hypertonics, and how early he is in the disease process, certainly concerned that he will have continued worsening shift and accompanying worsening mental status. The patient's son is going to discuss with family and decide, will keep NPO p MN and take to the OR tomorrow morning at 07:30 if they would like to proceed. -will get volumetric CTH w/o contrast tonight for preop planning and intra-op navigation -please call with any concerns or questions  Jadene Pierini, MD 03/13/21 6:11 PM [facility]

## 2021-03-13 NOTE — Procedures (Signed)
Cortrak  Person Inserting Tube:  Osbaldo Mark, Verdon Cummins, RD Tube Type:  Cortrak - 43 inches Tube Size:  10 Tube Location:  Left nare Initial Placement:  Stomach Secured by: Bridle Technique Used to Measure Tube Placement:  Marking at nare/corner of mouth Cortrak Secured At:  74 cm  Cortrak Tube Team Note:  Consult received to place a Cortrak feeding tube.   X-ray is required, abdominal x-ray has been ordered by the Cortrak team. Please confirm tube placement before using the Cortrak tube.   If the tube becomes dislodged please keep the tube and contact the Cortrak team at [email or web address] (password [email or web address]) for replacement.  If after hours and replacement cannot be delayed, place a NG tube and confirm placement with an abdominal x-ray.    Eugene Gavia, MS, RD, LDN (she/her/hers) RD pager number and weekend/on-call pager number located in Amion.

## 2021-03-13 NOTE — Progress Notes (Signed)
PT Cancellation Note  Patient Details Name: Brendan Burgess MRN: [id number] DOB: 1960-06-19   Cancelled Treatment:    Reason Eval/Treat Not Completed: Active bedrest order Bedrest orders until noon. PT will re-attempt at later time when activity orders are updated.   Brendan Burgess A. Brendan Burgess PT, DPT Acute Rehabilitation Services Pager [phone] Office [phone]    Brendan Burgess 03/13/2021, 10:00 AM

## 2021-03-13 NOTE — Progress Notes (Signed)
OT Cancellation Note  Patient Details Name: Brendan Burgess MRN: [id number] DOB: 1960/08/20   Cancelled Treatment:    Reason Eval/Treat Not Completed: Patient not medically ready- BR until noon. Ot to check back at next appropriate time  Wynona Neat, OTR/L  Acute Rehabilitation Services Pager: [phone] Office: [phone] .  03/13/2021, 9:59 AM

## 2021-03-13 NOTE — Progress Notes (Signed)
Initial Nutrition Assessment  DOCUMENTATION CODES:   Not applicable  INTERVENTION:  Initiate tube feeding via Cortrak: - Start Osmolite 1.2 at 20 ml/h and advance by 10 ml/h every 4 hours to goal rate of 60 ml/h (1440 ml per day) - Prosource TF 45 ml daily  Tube feeding regimen at goal provides 1768 kcal, 91 gm protein, 1181 ml free water daily  NUTRITION DIAGNOSIS:   Inadequate oral intake related to inability to eat as evidenced by NPO status.  GOAL:   Patient will meet greater than or equal to 90% of their needs  MONITOR:   TF tolerance, Diet advancement, Labs, Weight trends  REASON FOR ASSESSMENT:   Consult Enteral/tube feeding initiation and management  ASSESSMENT:   Admitted to hospital 10/18 for R sided weakness secondary to stroke. PMH includes HTN and stroke 11 years ago.  CT head showing L basal ganglia hemorrhage. Pt is not a surgical candidate per Neurology. On hypertonic saline for cerebral edema and BP management. Pt is at high risk for intubation. Cortrak to be placed today for initiation of enteral nutrition.  Spoke with pt's wife via [employer] Selena Batten [phone]). She reports he was getting around without any assistance PTA and that until his stroke yesterday he was eating 3 meals per day and 1 bedtime snack, typically fruit. Explained to her that he would be having a feeding tube placed in order for him to receive nutrition and would continue to be monitored. Addressed her concerns as she states he had a tube placed 11 years ago and ended up with PNA.   She states that he has consistently weighed ~138 lbs but when they moved to the [place] about 5 months ago, he lost 1-2 lbs but has since regained and maintained.   Per chart review, pt was experiencing emesis yesterday. Noted pt with some abdominal distension today. Will start TF at a low rate and slowly advance. RN aware of plan.  Medications: protonix, senokot-S, cleviprex @12mL /hr (provides 576kcals daily from  lipids), 3% IV NaCl @ 22mL/hr  Labs: ionized Ca 1.05, HgbA1C 6.0 UOP: 72m x24 hours  NUTRITION - FOCUSED PHYSICAL EXAM:  Flowsheet Row Most Recent Value  Orbital Region No depletion  Upper Arm Region Mild depletion  Thoracic and Lumbar Region No depletion  Buccal Region No depletion  Temple Region No depletion  Clavicle Bone Region Mild depletion  Clavicle and Acromion Bone Region Mild depletion  Scapular Bone Region No depletion  Dorsal Hand No depletion  Patellar Region No depletion  Anterior Thigh Region No depletion  Posterior Calf Region No depletion  Edema (RD Assessment) None  Hair Reviewed  Eyes Unable to assess  Mouth Unable to assess (pt sleeping)  Skin Reviewed  Nails Reviewed       Diet Order:   Diet Order             Diet NPO time specified  Diet effective now                   EDUCATION NEEDS:   Not appropriate for education at this time  Skin:  Skin Assessment: Reviewed RN Assessment  Last BM:  PTA  Height:   Ht Readings from Last 1 Encounters:  03/13/21 5\' 2"  (1.575 m)    Weight:   Wt Readings from Last 1 Encounters:  03/06/2021 63.8 kg    BMI:  Body mass index is 25.73 kg/m.  Estimated Nutritional Needs:   Kcal:  1700-1900  Protein:  85-95g  Fluid:  >1.7L   Drusilla Kanner, RDN, LDN Clinical Nutrition

## 2021-03-13 NOTE — Anesthesia Preprocedure Evaluation (Addendum)
Anesthesia Evaluation  Patient identified by MRN, date of birth, ID band Patient awake    Reviewed: Allergy & Precautions, H&P , NPO status , Patient's Chart, lab work & pertinent test results  Airway Mallampati: III  TM Distance: >3 FB Neck ROM: Full    Dental no notable dental hx. (+) Teeth Intact, Dental Advisory Given   Pulmonary neg pulmonary ROS,    Pulmonary exam normal breath sounds clear to auscultation       Cardiovascular Exercise Tolerance: Good negative cardio ROS   Rhythm:Regular Rate:Normal     Neuro/Psych CVA, Residual Symptoms negative psych ROS   GI/Hepatic negative GI ROS, Neg liver ROS,   Endo/Other  negative endocrine ROS  Renal/GU negative Renal ROS  negative genitourinary   Musculoskeletal   Abdominal   Peds  Hematology negative hematology ROS (+)   Anesthesia Other Findings   Reproductive/Obstetrics negative OB ROS                            Anesthesia Physical Anesthesia Plan  ASA: 3  Anesthesia Plan: General   Post-op Pain Management:    Induction: Intravenous  PONV Risk Score and Plan: 3 and Ondansetron, Dexamethasone and Treatment may vary due to age or medical condition  Airway Management Planned: Oral ETT  Additional Equipment: Arterial line  Intra-op Plan:   Post-operative Plan: Extubation in OR and Possible Post-op intubation/ventilation  Informed Consent: I have reviewed the patients History and Physical, chart, labs and discussed the procedure including the risks, benefits and alternatives for the proposed anesthesia with the patient or authorized representative who has indicated his/her understanding and acceptance.     Dental advisory given  Plan Discussed with: CRNA  Anesthesia Plan Comments:        Anesthesia Quick Evaluation

## 2021-03-13 NOTE — Progress Notes (Addendum)
 STROKE TEAM PROGRESS NOTE   INTERVAL HISTORY  Patient seen in room; wife and son at the bedside. Pt responds only to painful stimuli at left side, but no response at right.   Had lengthy conversation with patient's son and wife at bedside regarding plan of care. Will need feeding tube today, and possibly intubation if patient unable to maintain airway.   Vitals:   03/13/21 0645 03/13/21 0700 03/13/21 0800 03/13/21 0900  BP: 117/74 122/71 133/79 139/84  Pulse: 81 80 91 92  Resp: 19 20 18  (!) 22  Temp:   99.1 F (37.3 C)   TempSrc:   Axillary   SpO2: 100% 100% 97% 98%  Weight:       CBC:  Recent Labs  Lab 03/05/2021 1125 03/20/2021 1131 03/10/2021 1341  WBC 9.0  --  14.8*  NEUTROABS 4.3  --   --   HGB 12.4* 12.6* 13.0  HCT 38.4* 37.0* 39.0  MCV 85.3  --  83.2  PLT 389  --  413*   Basic Metabolic Panel:  Recent Labs  Lab 03/04/2021 1125 03/24/2021 1131 02/25/2021 1341 03/22/2021 1811 03/13/21 0007 03/13/21 0722  NA 134* 136   < > 135 138  --   K 3.8 3.7  --   --   --   --   CL 103 101  --   --   --   --   CO2 21*  --   --   --   --   --   GLUCOSE 125* 121*  --   --   --   --   BUN 11 12  --   --   --   --   CREATININE 0.93 0.90  --   --   --   --   CALCIUM 8.9  --   --   --   --   --   MG  --   --   --   --   --  2.1   < > = values in this interval not displayed.    Lipid Panel:  Recent Labs  Lab 03/04/2021 1341  CHOL 127  TRIG 138  HDL 45  CHOLHDL 2.8  VLDL 28  LDLCALC 54    HgbA1c:  Recent Labs  Lab 03/05/2021 1341  HGBA1C 6.0*   Urine Drug Screen:  Recent Labs  Lab 02/25/2021 1741  LABOPIA NONE DETECTED  COCAINSCRNUR NONE DETECTED  LABBENZ NONE DETECTED  AMPHETMU NONE DETECTED  THCU NONE DETECTED  LABBARB NONE DETECTED    Alcohol Level  Recent Labs  Lab 03/24/2021 1341  ETH <10    IMAGING past 24 hours CT HEAD WO CONTRAST (03/11/2021)  Result Date: 03/11/2021 CLINICAL DATA:  Follow-up intracranial hemorrhage. EXAM: CT HEAD WITHOUT CONTRAST TECHNIQUE:  Contiguous axial images were obtained from the base of the skull through the vertex without intravenous contrast. COMPARISON:  Head CT 03/19/2021 at 2:43 p.m. FINDINGS: Brain: An acute parenchymal hemorrhage centered in the left basal ganglia is unchanged in size, measuring 7.0 x 3.7 x 4.1 cm. Intraventricular extension is again noted with small volume hemorrhage in the lateral, third and fourth ventricles, overall similar in volume to the prior CT with slight interval redistribution. Edema surrounding the left basal ganglia hemorrhage is unchanged, as is rightward midline shift measuring 8 mm. Effacement of the lateral and third ventricles and mild dilatation of the right lateral ventricle suggestive of trapping are unchanged. A trace subdural hematoma over the left  cerebral convexity and along the left tentorium is unchanged, measuring 3 mm in maximal thickness. No new intracranial hemorrhage or acute cortically based infarct is identified. Hypodensities in the cerebral white matter bilaterally are unchanged and nonspecific but compatible with mild chronic small vessel ischemic disease. Vascular: Calcified atherosclerosis at the skull base. No hyperdense vessel. Skull: No fracture or suspicious osseous lesion. Sinuses/Orbits: Mild mucosal thickening in the left maxillary sinus. Clear mastoid air cells. Right cataract extraction. Other: None. IMPRESSION: 1. Unchanged left basal ganglia hemorrhage with intraventricular extension and 8 mm of rightward midline shift. 2. Unchanged trace left-sided subdural hematoma. 3. No evidence of new intracranial abnormality. Electronically Signed   By: Sebastian Ache M.D.   On: 02/25/2021 20:58   CT HEAD WO CONTRAST ( )  Result Date: 03/11/2021 CLINICAL DATA:  Follow-up intracranial hemorrhage. EXAM: CT HEAD WITHOUT CONTRAST TECHNIQUE: Contiguous axial images were obtained from the base of the skull through the vertex without intravenous contrast. COMPARISON:  03/10/2021 at  11:32 a.m. FINDINGS: Brain: An acute left basal ganglia hemorrhage has mildly enlarged and now measures 7.0 x 3.7 x 4.1 cm (AP x transverse x craniocaudal, estimated volume of 53 mL). There is new intraventricular extension with a small amount of hemorrhage in the left lateral, third, and fourth ventricles. Mild edema surrounding the left basal ganglia hemorrhage is similar to the prior study, however rightward midline shift has increased and now measures 8 mm. The left lateral and third ventricles are effaced, and mild dilatation of the right lateral ventricle has slightly increased. A trace subdural hematoma over the left cerebral convexity measuring up to 3 mm in thickness was not clearly present on the prior study. No acute cortically based infarct is identified. Hypodensities elsewhere in the cerebral white matter bilaterally are unchanged and nonspecific but compatible with mild chronic small vessel ischemic disease. Vascular: Calcified atherosclerosis at the skull base. Skull: No fracture or suspicious osseous lesion. Sinuses/Orbits: Mild, partially polypoid mucosal thickening in the left maxillary sinus. Clear mastoid air cells. Right cataract extraction. Other: None. IMPRESSION: 1. Mildly increased size of left basal ganglia hemorrhage with new intraventricular extension. 2. Increased rightward midline shift, now 8 mm. Slightly increased dilatation of the right lateral ventricle which may reflect trapping. 3. New trace subdural hematoma over the left cerebral convexity. Electronically Signed   By: Sebastian Ache M.D.   On: 03/19/2021 16:57   MR BRAIN WO CONTRAST  Result Date: 03/13/2021 CLINICAL DATA:  60 year old male code stroke presentation with left hemisphere intra-axial hemorrhage. EXAM: MRI HEAD WITHOUT CONTRAST TECHNIQUE: Multiplanar, multiecho pulse sequences of the brain and surrounding structures were obtained without intravenous contrast. COMPARISON:  CT of the head 03/20/2021. FINDINGS:  Brain: T2 heterogeneous but mostly hyperintense and T1 mostly isointense intra-axial hemorrhage centered at the left basal ganglia encompasses 65 x 42 by 42 mm (AP by transverse by CC) for an estimated blood volume of 57 mL, not significantly changed from 1443 hours yesterday. Regional edema and mass effect. No overt uncal herniation at this time. Furthermore there is a subtle left posterior convexity subdural or less likely subarachnoid hemorrhage best seen on series 11, image 18. Subsequent rightward midline shift of up to 10 mm. Intraventricular extension of blood, relatively small volume including trace layering in the occipital horns. Effaced left lateral ventricle. And the right lateral ventricle appears mildly trapped with possible transependymal edema as seen on series 11, image 15. Basilar cisterns remain patent. Encephalomalacia in the right hemisphere appears related to remote hemorrhage  in the right external capsule or lentiform as seen on series 11, image 14 and series 14, image 27. Chronic microhemorrhage also suspected in the left deep cerebellar nuclei. Subtle chronic lacunar infarct of the left PICA territory. No definite cortical encephalomalacia. DWI susceptibility associated with the blood products. Additionally, 2 small foci of cortical or subcortical white matter restricted diffusion are noted in the left superior frontal lobe on series 5 images 94 and 97. But no other convincing ischemic infarct. Cervicomedullary junction and pituitary are within normal limits. Vascular: Major intracranial vascular flow voids are preserved. There is mild generalized intracranial artery tortuosity. Skull and upper cervical spine: Negative visible cervical spine, bone marrow signal. Sinuses/Orbits: Postoperative changes to the right globe. Mild to moderate left maxillary sinus mucosal thickening. Other: Mastoids are clear. IMPRESSION: 1. Relatively large, estimated 57 mL intra-axial hemorrhage centered at the  left basal ganglia, stable since 1443 hours yesterday. Regional edema and mass effect. 2. Also there is trace extra-axial hemorrhage along the left posterior convexity, Subdural versus Subarachnoid hematoma. 3. Stable intraventricular extension of hemorrhage. Compressed left lateral ventricle and trapped right lateral ventricle with perhaps mild transependymal edema. 4. Subsequent rightward midline shift up to 10 mm. Basilar cisterns remain patent. No uncal herniation at this time. 5. Positive also for two punctate acute lacunar infarcts in the left superior frontal lobe. 6. 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 Electronically Signed   By: Odessa Fleming M.D.   On: 03/13/2021 07:31   DG Chest Port 1 View  Result Date: 03/14/2021 CLINICAL DATA:  Stroke EXAM: PORTABLE CHEST 1 VIEW COMPARISON:  None. FINDINGS: The heart size and mediastinal contours are within normal limits. Atherosclerotic calcification of the aortic knob. Minimal streaky opacity within the left lung base. Lungs are otherwise clear. No pleural effusion or pneumothorax. The visualized skeletal structures are unremarkable. IMPRESSION: Minimal streaky opacity within the left lung base, likely atelectasis. Lungs otherwise clear. Electronically Signed   By: Duanne Guess D.O.   On: 02/25/2021 14:48   CT HEAD CODE STROKE WO CONTRAST  Result Date:  CLINICAL DATA:  Code stroke.  Left-sided gaze. EXAM: CT HEAD WITHOUT CONTRAST TECHNIQUE: Contiguous axial images were obtained from the base of the skull through the vertex without intravenous contrast. COMPARISON:  None. FINDINGS: Brain: A left basal ganglia and external capsule hemorrhage measures 4.7 x 6.1 x 2.4 cm. Minimal amount subarachnoid hemorrhage is present on the left. No definite intraventricular hemorrhage is present. No hydrocephalus is present. 3 mm of midline shift is present. Mass effect  effaces the left lateral ventricle. There is effacement of the sulci over the left convexity. Moderate diffuse white matter disease is present. Remote nonhemorrhagic lacunar infarcts are present in the right thalamus. Brainstem and cerebellum are within normal limits. Vascular: Atherosclerotic calcifications are present within the cavernous internal carotid arteries. No hyperdense vessel is present. Skull: Calvarium is intact. No focal lytic or blastic lesions are present. Left supraorbital scalp soft tissue swelling is present without underlying fracture. Sinuses/Orbits: The paranasal sinuses and mastoid air cells are clear. Bilateral lens replacements are noted. Globes and orbits are otherwise unremarkable. IMPRESSION: 1. 4.7 x 6.1 x 2.4 cm left basal ganglia and external capsule hemorrhage. 2. Minimal amount of subarachnoid hemorrhage on the left. 3. Mass effect effaces the left lateral ventricle with 3 mm of midline shift. 4. Left supraorbital scalp soft tissue swelling without underlying fracture. 5. Moderate diffuse white matter  disease likely reflects the sequela of chronic microvascular ischemia. The above was relayed via text pager to Dr. Otelia Limes on 03/20/2021 at 11:36 . Electronically Signed   By: Marin Roberts M.D.   On: 02/27/2021 11:41    PHYSICAL EXAM  HEENT-  [place]/AT  Lungs - Respirations unlabored Extremities - No edema   Neurologic Examination: Ment: Exam assisted by patient's son at room. Awake with decreased level of alertness. Right hemineglect. Nonverbal to all questions.  Not following any commands, except for lifting of LUE, which were performed after a significant delay.  CN: PERRL. No blink to threat. Eyes deviated to the left; cannot overcome with oculocephalic maneuver. Has leftward nystagmus Decreased responses to right sided stimuli. Right facial droop. Nonverbal. Head rotated preferentially to the left. Does not protrude tongue to command.  Motor/Sensory: Moves LUE  spontaneously and also will elevate antigravity to command, after a significant delay.  Withdraws LLE to noxious stimuli. Triple flexion.    RUE: Flaccid tone with no movement to noxious.  RLE: Minimal movement to noxious.  Reflexes: 2+ bilateral brachioradialis and patellae. Right toe mute, left toe upgoing.   Cerebellar: Not following commands for testing.  Gait: Unable to assess    ASSESSMENT/PLAN Mr. Brendan Burgess is a 60 y.o. male with history of HTN and ICH approximately 11 years ago while in [place] (had left sided weakness and required PT), presenting acutely from home via EMS as a Code Stroke after family noted him to be acutely nonverbal with right sided weakness.   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 CT head  left basal ganglia and external capsule hemorrhage. Minimal amount of subarachnoid hemorrhage on the left. 3 mm of midline shift.  CT repeat x 2 showed progressive cerebral edema 40mm MLS MRI large, estimated 57 mL intra-axial hemorrhage centered at the left basal ganglia. Regional edema and mass effect. Subdural versus Subarachnoid hematoma. midline shift up to 10 mm.  Positive also for two punctate acute lacunar infarcts in the left superior frontal lobe. 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 2D Echo  pending LDL 54 HgbA1c 6.0 VTE prophylaxis - scd aspirin 81 mg daily and clopidogrel 75 mg daily prior to admission, now on No antithrombotic due to IPH Therapy recommendations:  pending Disposition:  pending  Cerebral edema CT head with 3 mm of midline shift.  CT repeat x 2 showed progressive cerebral edema 31mm MLS MRI large, estimated 57 mL intra-axial hemorrhage centered at the left basal ganglia with midline shift up to 10 mm.  On 3% saline @50 ->75 Neurosurgery on board Na 136->138 Na goal 150-155 Will consider  23.4%  Hypertensive emergency Home meds:  atenolol, micardis Unstable On cleviprex Will add PO meds after po access BP goal < 160 Long-term BP goal normotensive  Hyperlipidemia Home meds:  crestor,  LDL 54, goal < 70 Hold off statin for now due to ICH Consider to resume statin at discharge  Other Stroke Risk Factors  hypertension  Hx ICH at right BG 11 years ago with mild residue left weakness   Other Active Problems  Leukocytosis WBC 9.0->14.8  Hospital day # 1    ATTENDING NOTE: I reviewed above note and agree with the assessment and plan. Pt was seen and examined.   59 year old male with history of hypertension, ICH 11 years ago with residual mild left-sided weakness admitted for nonverbal, right-sided  weakness, right facial droop and left gaze.  CT showed left BG ICH with IVH and midline shift of 3 mm.  Repeat CT showed progressive cerebral edema to 80 mm of midline shift.  MRI showed large left BG hemorrhage with midline shift 10 mm, old right external capsule hemorrhage.  2D echo pending.  UDS negative.  A1c 6.0, LDL 54.  WBC 9.0-14.8.  Creatinine 0.9.  On exam, patient son and the wife at bedside, drowsy sleepy and obtunded.  Barely open eyes on voice, not follow commands, nonverbal.  With forced eye opening, left forced gaze and nystagmus with direction to the left.  Blinking to visual threat on the left but not to the right.  Pupil 3 mm bilaterally, sluggish to light reaction.  Slight right nasolabial fold flattening.  Right upper and lower extremity flaccid.  Left upper extremity 3/5 draped.  Left lower extremity 2+/5 on pain summation.  Babinski negative. Sensation, coordination not corporative and gait not tested.  Patient currently on 3% saline sodium 138 this morning, increase 3% saline from 50 cc to 75 cc/h.  Will consider 23.4% saline infusion to increase sodium level.  Not passing swallow, will do core track for tube feeding.  BP under control with Cleviprex, will  start p.o. BP meds after p.o. access.  Neurosurgery on board, currently no surgical intervention considered by Dr. Dutch Quint.  I had long discussion with son and wife at bedside, updated pt current condition, treatment plan and potential prognosis, and answered all the questions.  Patient currently high risk for intubation, high risk for worsening neurological condition with brain herniation.  Son expressed understanding.  For detailed assessment and plan, please refer to above as I have made changes wherever appropriate.    This patient is critically ill due to large left BG ICH, IVH, cerebral edema, hypertensive emergency and at significant risk of neurological worsening, death form hematoma expansion, brain herniation, respiratory failure, hydrocephalus, brain death. This patient's care requires constant monitoring of vital signs, hemodynamics, respiratory and cardiac monitoring, review of multiple databases, neurological assessment, discussion with family, other specialists and medical decision making of high complexity. I spent 50 minutes of neurocritical care time in the care of this patient. I had long discussion with son and wife at bedside, updated pt current condition, treatment plan and potential prognosis, and answered all the questions. They expressed understanding and appreciation. I also discussed with Dr. Rodman Key, MD PhD Stroke Neurology 03/13/2021 10:32 AM    To contact Stroke Continuity provider, please refer to [email or web address]. After hours, contact General Neurology

## 2021-03-13 NOTE — Progress Notes (Signed)
NAME:  Brendan Burgess, MRN:  [id number], DOB:  May 01, 1961, LOS: 1 ADMISSION DATE:  03/07/2021, CONSULTATION DATE: 02/26/2021 REFERRING MD: Dr. Arbutus Ped, CHIEF COMPLAINT:  Basal ganglia hemorrhagic stroke    History of Present Illness:  Brendan Burgess is a 60 y.o. male who presented to [facility] ED 10/18 as a Code Stroke due to acute onset aphasia, left-sided gaze and right-sided weakness with facial droop. LKW 0915. PMHx significant for HTN and prior CVA (on ASA).  On ED arrival, patient was taken emergently for CT Head which demonstrated  left basal ganglia hemorrhage (4.7 x 6.1 x 2.4cm). ICH score 3.Of note, patient vomited multiple times in ED/ICU.   Given neurologic status and vomiting, increased concern for airway protection prompted PCCM consult for further assistance in management.   Pertinent Medical History:  Prior CVA on ASA, HTN  Significant Hospital Events:   10/18 - Admitted as code stroke with acute onset aphasia, left gaze, and right sided weakness with facial droop. LKW 0915. Vomited twice PCCM consulted for possible need of airway management  10/19 - MRI Brain with estimated 19mL intra-axial hemorrhage at L basal ganglia (stable) with regional edema/mass effect; trace extra-axial hemorrhage (SDH vs. SAH), stable intraventricular extension of hemorrhage (compressed L lateral ventricle/trapped R lateral ventricle), rightward midline shift 68mm, no herniation; two punctate acute lacunar infarcts L frontal lobe, chronic encephalomalacia/small vessel disease.  Interim History / Subjective:  No acute events overnight Wife at bedside MRI Brain as above Responding to painful stimuli on L only Remains at risk for intubation  Objective:  Blood pressure 139/84, pulse 92, temperature 99.1 F (37.3 C), temperature source Axillary, resp. rate (!) 22, weight 63.8 kg, SpO2 98 %.        Intake/Output Summary (Last 24 hours) at 03/13/2021 0930 Last data filed at 03/13/2021 0900 Gross per 24 hour   Intake 1670.61 ml  Output 1725 ml  Net -54.39 ml    Filed Weights   03/03/2021 1100  Weight: 63.8 kg   Physical Examination: General: Acutely ill-appearing middle-aged man in NAD. HEENT: [place]/AT, R facial droop. Anicteric sclera, PERRL, leftward gaze deviation, moist mucous membranes. Neuro:  Awake, unable to assess orientation - decreased alertness.  Withdraws to pain in LL extremity. Following commands intermittently. Unilateral R-sided neglect noted. +Cough and +Gag  CV: RRR, no m/g/r. PULM: Breathing even and unlabored on RA. Lung fields CTAB. GI: Soft, nontender, nondistended. Normoactive bowel sounds. Extremities: No LE edema noted. Skin: Warm/dry, no rashes.  Resolved Hospital Problem List:     Assessment & Plan:  Large left basal ganglia hemorrhage with associated mass effect, edema Presented for Code Stroke with acute onset of aphasia, L-gaze and R-sided weakness. CT Head on admission 10/18 revealed a 4.7 x 6.1 x 2.4cm left basal ganglia hemorrhage. ICH score 3. - ICU monitoring - Primary management per Neurology/Stroke team - NSGY consulted, no role for surgical intervention at this juncture - Hypertonic saline (3%) for edema, given mass effect - Goal Na 150-155, considering 23.4% - Cleviprex, goal SBP < 140 - Additional imaging per Neurology - Seizure precautions - Neuroprotective measures: HOB > 30 degrees, normoglycemia, normothermia, electrolytes WNL - PT/OT/SLP when appropriate from clinical perspective  Hypertensive emergency History of HTN BP 174/133 on admit. Working with family to determine home medication regimen. - Cleviprex gtt, wean to goal SBP - Hydralazine IV PRN - PO agents once stable enteral access establish without concern for airway compromise - Goal SBP < 140 per Neuro - Cardiac monitoring/tele  At risk for respiratory compromise  Given decreased mentation in the setting of ICH and vomiting, patient is at risk for inability to protect his  airway.  - Close monitoring, may need intubation if mentation worsens - Minimize sedation as able - Keep HOB elevated > 30 degrees - NTS/oral suctioning PRN - Pulmonary hygiene  Hyperlipidemia Crestor for home regimen. - Resume statin at discharge  Best Practice:   Diet/type: NPO DVT prophylaxis: SCD GI prophylaxis: PPI Lines: N/A Foley:  N/A Code Status:  full code Last date of multidisciplinary goals of care discussion: Pending   Critical care time: 35 minutes   Tim Lair, PA-C [facility] 03/13/21 9:30 AM  Please see [email or web address] for pager details.  From 7A-7P if no response, please call [phone] After hours, please call ELink [phone]

## 2021-03-13 NOTE — Progress Notes (Signed)
SLP Cancellation Note  Patient Details Name: Brendan Burgess MRN: [id number] DOB: 08-Jan-1961   Cancelled treatment:       Reason Eval/Treat Not Completed: Fatigue/lethargy limiting ability to participate. RN recommending SLP f/u on subsequent date for completion of speech-language eval. Will f/u.    Avie Echevaria, MA, CCC-SLP Acute Rehabilitation Services Office Number: [phone]  Paulette Blanch 03/13/2021, 11:54 AM

## 2021-03-13 NOTE — TOC CAGE-AID Note (Addendum)
Transition of Care [facility]) - CAGE-AID Screening   Patient Details  Name: Brendan Burgess MRN: [id number] Date of Birth: Jun 12, 1960  Transition of Care [facility]) CM/SW Contact:    Aylanie Cubillos C Tarpley-Carter, LCSWA Phone Number: 03/13/2021, 11:06 AM   Clinical Narrative: Pt is unable to participate in Cage Aid.   Krystle Oberman Tarpley-Carter, MSW, LCSW-A Pronouns:  She/Her/Hers [facility]itions of Care Clinical Social Worker Direct Number:  [phone] [email or web address]    CAGE-AID Screening: Substance Abuse Screening unable to be completed due to: : Patient unable to participate             Substance Abuse Education Offered: No

## 2021-03-14 ENCOUNTER — Inpatient Hospital Stay (HOSPITAL_COMMUNITY): Payer: Self-pay

## 2021-03-14 ENCOUNTER — Inpatient Hospital Stay: Payer: Self-pay

## 2021-03-14 ENCOUNTER — Inpatient Hospital Stay (HOSPITAL_COMMUNITY): Payer: Self-pay | Admitting: Anesthesiology

## 2021-03-14 ENCOUNTER — Encounter (HOSPITAL_COMMUNITY): Payer: Self-pay | Admitting: Neurology

## 2021-03-14 ENCOUNTER — Encounter (HOSPITAL_COMMUNITY): Admission: EM | Disposition: E | Payer: Self-pay | Source: Home / Self Care | Attending: Neurology

## 2021-03-14 DIAGNOSIS — J9601 Acute respiratory failure with hypoxia: Secondary | ICD-10-CM

## 2021-03-14 DIAGNOSIS — G935 Compression of brain: Secondary | ICD-10-CM

## 2021-03-14 DIAGNOSIS — Z4659 Encounter for fitting and adjustment of other gastrointestinal appliance and device: Secondary | ICD-10-CM

## 2021-03-14 DIAGNOSIS — I615 Nontraumatic intracerebral hemorrhage, intraventricular: Principal | ICD-10-CM

## 2021-03-14 HISTORY — PX: APPLICATION OF CRANIAL NAVIGATION: SHX6578

## 2021-03-14 HISTORY — PX: BURR HOLE: SHX908

## 2021-03-14 LAB — POCT I-STAT 7, (LYTES, BLD GAS, ICA,H+H)
Acid-base deficit: 1 mmol/L (ref 0.0–2.0)
Bicarbonate: 22.6 mmol/L (ref 20.0–28.0)
Calcium, Ion: 1.19 mmol/L (ref 1.15–1.40)
HCT: 34 % — ABNORMAL LOW (ref 39.0–52.0)
Hemoglobin: 11.6 g/dL — ABNORMAL LOW (ref 13.0–17.0)
O2 Saturation: 100 %
Patient temperature: 99.4
Potassium: 3.5 mmol/L (ref 3.5–5.1)
Sodium: 154 mmol/L — ABNORMAL HIGH (ref 135–145)
TCO2: 24 mmol/L (ref 22–32)
pCO2 arterial: 35.1 mmHg (ref 32.0–48.0)
pH, Arterial: 7.419 (ref 7.350–7.450)
pO2, Arterial: 391 mmHg — ABNORMAL HIGH (ref 83.0–108.0)

## 2021-03-14 LAB — SODIUM
Sodium: 150 mmol/L — ABNORMAL HIGH (ref 135–145)
Sodium: 153 mmol/L — ABNORMAL HIGH (ref 135–145)

## 2021-03-14 LAB — BASIC METABOLIC PANEL
Anion gap: 10 (ref 5–15)
BUN: 8 mg/dL (ref 6–20)
CO2: 19 mmol/L — ABNORMAL LOW (ref 22–32)
Calcium: 9.1 mg/dL (ref 8.9–10.3)
Chloride: 121 mmol/L — ABNORMAL HIGH (ref 98–111)
Creatinine, Ser: 0.84 mg/dL (ref 0.61–1.24)
GFR, Estimated: >60 mL/min (ref >60)
Glucose, Bld: 181 mg/dL — ABNORMAL HIGH (ref 70–99)
Potassium: 3.5 mmol/L (ref 3.5–5.1)
Sodium: 150 mmol/L — ABNORMAL HIGH (ref 135–145)

## 2021-03-14 LAB — SURGICAL PCR SCREEN
MRSA, PCR: POSITIVE — AB
Staphylococcus aureus: POSITIVE — AB

## 2021-03-14 LAB — CBC
HCT: 38 % — ABNORMAL LOW (ref 39.0–52.0)
Hemoglobin: 12.5 g/dL — ABNORMAL LOW (ref 13.0–17.0)
MCH: 28 pg (ref 26.0–34.0)
MCHC: 32.9 g/dL (ref 30.0–36.0)
MCV: 85 fL (ref 80.0–100.0)
Platelets: 467 10*3/uL — ABNORMAL HIGH (ref 150–400)
RBC: 4.47 MIL/uL (ref 4.22–5.81)
RDW: 13.7 % (ref 11.5–15.5)
WBC: 11.5 10*3/uL — ABNORMAL HIGH (ref 4.0–10.5)
nRBC: 0 % (ref 0.0–0.2)

## 2021-03-14 LAB — GLUCOSE, CAPILLARY
Glucose-Capillary: 136 mg/dL — ABNORMAL HIGH (ref 70–99)
Glucose-Capillary: 180 mg/dL — ABNORMAL HIGH (ref 70–99)
Glucose-Capillary: 224 mg/dL — ABNORMAL HIGH (ref 70–99)
Glucose-Capillary: 178 mg/dL — ABNORMAL HIGH (ref 70–99)
Glucose-Capillary: 183 mg/dL — ABNORMAL HIGH (ref 70–99)
Glucose-Capillary: 250 mg/dL — ABNORMAL HIGH (ref 70–99)
Glucose-Capillary: 216 mg/dL — ABNORMAL HIGH (ref 70–99)

## 2021-03-14 LAB — PHOSPHORUS
Phosphorus: 1.4 mg/dL — ABNORMAL LOW (ref 2.5–4.6)
Phosphorus: 1.4 mg/dL — ABNORMAL LOW (ref 2.5–4.6)

## 2021-03-14 LAB — MAGNESIUM
Magnesium: 2.4 mg/dL (ref 1.7–2.4)
Magnesium: 2.6 mg/dL — ABNORMAL HIGH (ref 1.7–2.4)

## 2021-03-14 SURGERY — CREATION, CRANIAL BURR HOLE
Anesthesia: General | Site: Head | Laterality: Left

## 2021-03-14 MED ORDER — ROCURONIUM BROMIDE 10 MG/ML (PF) SYRINGE
PREFILLED_SYRINGE | INTRAVENOUS | Status: AC
  Filled 2021-03-14: qty 10

## 2021-03-14 MED ORDER — PROPOFOL 10 MG/ML IV BOLUS
INTRAVENOUS | Status: DC | PRN
  Administered 2021-03-14: 30 mg via INTRAVENOUS
  Administered 2021-03-14: 100 mg via INTRAVENOUS

## 2021-03-14 MED ORDER — THROMBIN 5000 UNITS EX SOLR
CUTANEOUS | Status: AC
  Filled 2021-03-14: qty 10000

## 2021-03-14 MED ORDER — PROPOFOL 1000 MG/100ML IV EMUL
INTRAVENOUS | Status: AC
  Filled 2021-03-14: qty 100

## 2021-03-14 MED ORDER — LACTATED RINGERS IV SOLN: INTRAVENOUS | Status: DC | PRN

## 2021-03-14 MED ORDER — CHLORHEXIDINE GLUCONATE 0.12% ORAL RINSE (MEDLINE KIT)
15.0000 mL | Freq: Two times a day (BID) | OROMUCOSAL | Status: DC
  Administered 2021-03-14 – 2021-03-15 (×4): 15 mL via OROMUCOSAL

## 2021-03-14 MED ORDER — FENTANYL CITRATE PF 50 MCG/ML IJ SOSY
25.0000 ug | PREFILLED_SYRINGE | INTRAMUSCULAR | Status: DC | PRN
  Administered 2021-03-14: 50 ug via INTRAVENOUS
  Administered 2021-03-14 – 2021-03-15 (×4): 100 ug via INTRAVENOUS
  Filled 2021-03-14: qty 2
  Filled 2021-03-14: qty 1
  Filled 2021-03-14 (×2): qty 2

## 2021-03-14 MED ORDER — ONDANSETRON HCL 4 MG/2ML IJ SOLN
INTRAMUSCULAR | Status: AC
  Filled 2021-03-14: qty 2

## 2021-03-14 MED ORDER — MANNITOL 25 % IV SOLN
50.0000 g | Freq: Once | Status: AC
  Administered 2021-03-14: 50 g via INTRAVENOUS
  Filled 2021-03-14: qty 200

## 2021-03-14 MED ORDER — FENTANYL CITRATE (PF) 250 MCG/5ML IJ SOLN
INTRAMUSCULAR | Status: AC
  Filled 2021-03-14: qty 5

## 2021-03-14 MED ORDER — IRBESARTAN 150 MG PO TABS: 150.0000 mg | ORAL_TABLET | Freq: Two times a day (BID) | ORAL | Status: DC

## 2021-03-14 MED ORDER — 0.9 % SODIUM CHLORIDE (POUR BTL) OPTIME
TOPICAL | Status: DC | PRN
  Administered 2021-03-14: 2000 mL

## 2021-03-14 MED ORDER — PROPOFOL 1000 MG/100ML IV EMUL
5.0000 ug/kg/min | INTRAVENOUS | Status: DC
  Administered 2021-03-14 (×2): 20 ug/kg/min via INTRAVENOUS
  Filled 2021-03-14: qty 100

## 2021-03-14 MED ORDER — LIDOCAINE 2% (20 MG/ML) 5 ML SYRINGE
INTRAMUSCULAR | Status: AC
  Filled 2021-03-14: qty 5

## 2021-03-14 MED ORDER — LIDOCAINE-EPINEPHRINE 1 %-1:100000 IJ SOLN
INTRAMUSCULAR | Status: AC
  Filled 2021-03-14: qty 1

## 2021-03-14 MED ORDER — PROPOFOL BOLUS VIA INFUSION
50.0000 mg | Freq: Once | INTRAVENOUS | Status: AC
  Administered 2021-03-14: 50 mg via INTRAVENOUS

## 2021-03-14 MED ORDER — IRBESARTAN 150 MG PO TABS
150.0000 mg | ORAL_TABLET | Freq: Two times a day (BID) | ORAL | Status: DC
  Administered 2021-03-14 – 2021-03-15 (×3): 150 mg
  Filled 2021-03-14 (×3): qty 1

## 2021-03-14 MED ORDER — CEFAZOLIN SODIUM-DEXTROSE 2-3 GM-%(50ML) IV SOLR
INTRAVENOUS | Status: DC | PRN
  Administered 2021-03-14: 2 g via INTRAVENOUS

## 2021-03-14 MED ORDER — THROMBIN 5000 UNITS EX SOLR: OROMUCOSAL | Status: DC | PRN

## 2021-03-14 MED ORDER — BACITRACIN ZINC 500 UNIT/GM EX OINT
TOPICAL_OINTMENT | CUTANEOUS | Status: DC | PRN
  Administered 2021-03-14: 1 via TOPICAL

## 2021-03-14 MED ORDER — ORAL CARE MOUTH RINSE
15.0000 mL | OROMUCOSAL | Status: DC
  Administered 2021-03-14 – 2021-03-15 (×13): 15 mL via OROMUCOSAL

## 2021-03-14 MED ORDER — THROMBIN 5000 UNITS EX SOLR
CUTANEOUS | Status: AC
  Filled 2021-03-14: qty 5000

## 2021-03-14 MED ORDER — FENTANYL CITRATE PF 50 MCG/ML IJ SOSY
PREFILLED_SYRINGE | INTRAMUSCULAR | Status: AC
  Filled 2021-03-14: qty 2

## 2021-03-14 MED ORDER — PHENYLEPHRINE 40 MCG/ML (10ML) SYRINGE FOR IV PUSH (FOR BLOOD PRESSURE SUPPORT)
PREFILLED_SYRINGE | INTRAVENOUS | Status: DC | PRN
  Administered 2021-03-14: 120 ug via INTRAVENOUS

## 2021-03-14 MED ORDER — LIDOCAINE-EPINEPHRINE 1 %-1:100000 IJ SOLN
INTRAMUSCULAR | Status: DC | PRN
  Administered 2021-03-14: 20 mL

## 2021-03-14 MED ORDER — FENTANYL CITRATE (PF) 100 MCG/2ML IJ SOLN
INTRAMUSCULAR | Status: DC | PRN
  Administered 2021-03-14: 100 ug via INTRAVENOUS

## 2021-03-14 MED ORDER — KETAMINE HCL 50 MG/5ML IJ SOSY
PREFILLED_SYRINGE | INTRAMUSCULAR | Status: AC
  Filled 2021-03-14: qty 5

## 2021-03-14 MED ORDER — ROCURONIUM BROMIDE 50 MG/5ML IV SOLN
100.0000 mg | Freq: Once | INTRAVENOUS | Status: AC
  Administered 2021-03-14: 100 mg via INTRAVENOUS

## 2021-03-14 MED ORDER — ATENOLOL 50 MG PO TABS
50.0000 mg | ORAL_TABLET | Freq: Every day | ORAL | Status: DC
  Administered 2021-03-14 – 2021-03-15 (×2): 50 mg
  Filled 2021-03-14 (×2): qty 1

## 2021-03-14 MED ORDER — SODIUM CHLORIDE 0.9% FLUSH: 10.0000 mL | INTRAVENOUS | Status: DC | PRN

## 2021-03-14 MED ORDER — MIDAZOLAM HCL 2 MG/2ML IJ SOLN
INTRAMUSCULAR | Status: AC
  Filled 2021-03-14: qty 2

## 2021-03-14 MED ORDER — FENTANYL CITRATE (PF) 100 MCG/2ML IJ SOLN: 25.0000 ug | INTRAMUSCULAR | Status: DC | PRN

## 2021-03-14 MED ORDER — CEFAZOLIN SODIUM-DEXTROSE 2-4 GM/100ML-% IV SOLN
INTRAVENOUS | Status: AC
  Filled 2021-03-14: qty 100

## 2021-03-14 MED ORDER — LIDOCAINE 2% (20 MG/ML) 5 ML SYRINGE
INTRAMUSCULAR | Status: DC | PRN
  Administered 2021-03-14: 100 mg via INTRAVENOUS

## 2021-03-14 MED ORDER — ATENOLOL 50 MG PO TABS: 50.0000 mg | ORAL_TABLET | Freq: Every day | ORAL | Status: DC

## 2021-03-14 MED ORDER — SUCCINYLCHOLINE CHLORIDE 200 MG/10ML IV SOSY
PREFILLED_SYRINGE | INTRAVENOUS | Status: DC | PRN
  Administered 2021-03-14: 100 mg via INTRAVENOUS

## 2021-03-14 MED ORDER — LIDOCAINE HCL (CARDIAC) PF 100 MG/5ML IV SOSY
PREFILLED_SYRINGE | INTRAVENOUS | Status: AC
  Filled 2021-03-14: qty 5

## 2021-03-14 MED ORDER — PROPOFOL 10 MG/ML IV BOLUS
INTRAVENOUS | Status: AC
  Filled 2021-03-14: qty 20

## 2021-03-14 MED ORDER — SUCCINYLCHOLINE CHLORIDE 200 MG/10ML IV SOSY
PREFILLED_SYRINGE | INTRAVENOUS | Status: AC
  Filled 2021-03-14: qty 10

## 2021-03-14 MED ORDER — SUGAMMADEX SODIUM 200 MG/2ML IV SOLN
INTRAVENOUS | Status: DC | PRN
  Administered 2021-03-14: 200 mg via INTRAVENOUS

## 2021-03-14 MED ORDER — SODIUM CHLORIDE 0.9% FLUSH
10.0000 mL | Freq: Two times a day (BID) | INTRAVENOUS | Status: DC
  Administered 2021-03-14 – 2021-03-15 (×2): 10 mL

## 2021-03-14 MED ORDER — BACITRACIN ZINC 500 UNIT/GM EX OINT
TOPICAL_OINTMENT | CUTANEOUS | Status: AC
  Filled 2021-03-14: qty 28.35

## 2021-03-14 MED ORDER — ROCURONIUM BROMIDE 10 MG/ML (PF) SYRINGE
PREFILLED_SYRINGE | INTRAVENOUS | Status: DC | PRN
  Administered 2021-03-14: 40 mg via INTRAVENOUS

## 2021-03-14 MED ORDER — PHENYLEPHRINE 40 MCG/ML (10ML) SYRINGE FOR IV PUSH (FOR BLOOD PRESSURE SUPPORT)
PREFILLED_SYRINGE | INTRAVENOUS | Status: AC
  Filled 2021-03-14: qty 10

## 2021-03-14 MED ORDER — HEMOSTATIC AGENTS (NO CHARGE) OPTIME
TOPICAL | Status: DC | PRN
  Administered 2021-03-14: 1 via TOPICAL

## 2021-03-14 MED ORDER — MANNITOL 25 % IV SOLN
INTRAVENOUS | Status: AC
  Filled 2021-03-14: qty 50

## 2021-03-14 MED ORDER — ETOMIDATE 2 MG/ML IV SOLN
INTRAVENOUS | Status: AC
  Filled 2021-03-14: qty 20

## 2021-03-14 MED ORDER — LIDOCAINE HCL (PF) 2 % IJ SOLN: 5.0000 mL | Freq: Once | INTRAMUSCULAR | Status: DC

## 2021-03-14 MED ORDER — DEXAMETHASONE SODIUM PHOSPHATE 10 MG/ML IJ SOLN
INTRAMUSCULAR | Status: AC
  Filled 2021-03-14: qty 1

## 2021-03-14 MED ORDER — LIDOCAINE HCL (CARDIAC) PF 100 MG/5ML IV SOSY
100.0000 mg | PREFILLED_SYRINGE | Freq: Once | INTRAVENOUS | Status: AC
  Administered 2021-03-14: 100 mg via INTRAVENOUS

## 2021-03-14 MED ORDER — POTASSIUM PHOSPHATES 15 MMOLE/5ML IV SOLN
30.0000 mmol | Freq: Once | INTRAVENOUS | Status: AC
  Administered 2021-03-14: 30 mmol via INTRAVENOUS
  Filled 2021-03-14: qty 10

## 2021-03-14 SURGICAL SUPPLY — 39 items
BAG COUNTER SPONGE SURGICOUNT (BAG) ×3 IMPLANT
BLADE CLIPPER SURG (BLADE) ×3 IMPLANT
BUR ACORN 9.0 PRECISION (BURR) ×3 IMPLANT
CANISTER SUCT 3000ML PPV (MISCELLANEOUS) ×3 IMPLANT
CLIP VESOCCLUDE MED 6/CT (CLIP) ×3 IMPLANT
DRAPE NEUROLOGICAL W/INCISE (DRAPES) ×3 IMPLANT
DRAPE SHEET LG 3/4 BI-LAMINATE (DRAPES) ×3 IMPLANT
DRAPE WARM FLUID 44X44 (DRAPES) ×3 IMPLANT
DURAPREP 6ML APPLICATOR 50/CS (WOUND CARE) ×3 IMPLANT
ELECT REM PT RETURN 9FT ADLT (ELECTROSURGICAL) ×3
ELECTRODE REM PT RTRN 9FT ADLT (ELECTROSURGICAL) ×2 IMPLANT
GAUZE 4X4 16PLY ~~LOC~~+RFID DBL (SPONGE) ×3 IMPLANT
GAUZE SPONGE 4X4 12PLY STRL (GAUZE/BANDAGES/DRESSINGS) ×3 IMPLANT
GLOVE SURG LTX SZ7.5 (GLOVE) ×6 IMPLANT
GLOVE SURG UNDER POLY LF SZ7.5 (GLOVE) ×6 IMPLANT
GOWN STRL REUS W/ TWL LRG LVL3 (GOWN DISPOSABLE) ×4 IMPLANT
GOWN STRL REUS W/ TWL XL LVL3 (GOWN DISPOSABLE) ×2 IMPLANT
GOWN STRL REUS W/TWL LRG LVL3 (GOWN DISPOSABLE) ×2
GOWN STRL REUS W/TWL XL LVL3 (GOWN DISPOSABLE) ×1
HEMOSTAT POWDER KIT SURGIFOAM (HEMOSTASIS) ×3 IMPLANT
HEMOSTAT SURGICEL 2X14 (HEMOSTASIS) ×3 IMPLANT
KIT BASIN OR (CUSTOM PROCEDURE TRAY) ×3 IMPLANT
KIT TURNOVER KIT B (KITS) ×3 IMPLANT
NEEDLE HYPO 22GX1.5 SAFETY (NEEDLE) ×3 IMPLANT
NS IRRIG 1000ML POUR BTL (IV SOLUTION) ×3 IMPLANT
PACK CRANIOTOMY CUSTOM (CUSTOM PROCEDURE TRAY) ×3 IMPLANT
PIN MAYFIELD SKULL DISP (PIN) ×3 IMPLANT
SPONGE SURGIFOAM ABS GEL SZ50 (HEMOSTASIS) ×3 IMPLANT
STAPLER VISISTAT 35W (STAPLE) ×3 IMPLANT
SUT ETHIBOND 4 0 TF (SUTURE) ×3 IMPLANT
SUT MNCRL AB 3-0 PS2 18 (SUTURE) ×3 IMPLANT
SUT VIC AB 2-0 CP2 18 (SUTURE) ×3 IMPLANT
SYR 20ML LL LF (SYRINGE) ×3 IMPLANT
TOWEL GREEN STERILE (TOWEL DISPOSABLE) ×3 IMPLANT
TOWEL GREEN STERILE FF (TOWEL DISPOSABLE) ×3 IMPLANT
TRAY FOLEY MTR SLVR 16FR STAT (SET/KITS/TRAYS/PACK) ×3 IMPLANT
TUBE CONNECTING 12X1/4 (SUCTIONS) ×3 IMPLANT
UNDERPAD 30X36 HEAVY ABSORB (UNDERPADS AND DIAPERS) ×3 IMPLANT
WATER STERILE IRR 1000ML POUR (IV SOLUTION) ×3 IMPLANT

## 2021-03-14 NOTE — Progress Notes (Signed)
Transported pt to CT scan and back without complications.  

## 2021-03-14 NOTE — Progress Notes (Signed)
 OT Cancellation Note  Patient Details Name: Krishay Faro MRN: [id number] DOB: 1961/05/05   Cancelled Treatment:    Reason Eval/Treat Not Completed: Patient at procedure or test/ unavailable (Pt in surgery. will follow up when medically appropriate.)  [facility] , 7:46 AM Luisa Dago, OT/L   Acute OT Clinical Specialist Acute Rehabilitation Services Pager [phone] Office [phone]

## 2021-03-14 NOTE — Transfer of Care (Signed)
 Immediate Anesthesia Transfer of Care Note  Patient: Brendan Burgess  Procedure(s) Performed: Left [facility] Evacuation of Hematoma with Brainlab (Left) APPLICATION OF CRANIAL NAVIGATION (Left: Head)  Patient Location: PACU  Anesthesia Type:General  Level of Consciousness: responds to stimulation  Airway & Oxygen Therapy: Patient Spontanous Breathing and Patient connected to nasal cannula oxygen  Post-op Assessment: Report given to RN and Post -op Vital signs reviewed and stable  Post vital signs: Reviewed and stable  Last Vitals:  Vitals Value Taken Time  BP    Temp    Pulse 94  0925  Resp 22 03/04/2021 0925  SpO2 100 % 03/17/2021 0925  Vitals shown include unvalidated device data.  Last Pain:  Vitals:   03/06/2021 0400  TempSrc: Axillary  PainSc:          Complications: No notable events documented.

## 2021-03-14 NOTE — Progress Notes (Signed)
Neurosurgery Service Progress Note  Subjective: No acute events overnight   Objective: Vitals:   02/26/2021 0545 03/13/2021 0600 03/09/2021 0615 02/25/2021 0630  BP: (!) 163/86 (!) 161/97 (!) 173/101 (!) 178/93  Pulse: (!) 114 (!) 110 (!) 124 (!) 118  Resp: (!) 23 (!) 23 (!) 21 (!) 23  Temp:      TempSrc:      SpO2: 98% 99% 96% 99%  Weight:      Height:        Physical Exam:  Eyes open to stim, PERRL, gaze conjugate with L gaze deviation, globally aphasic w/ R hemineglect, LUE moves to stimulus in a purposeful manner and w/ LLE, R side flaccid  Assessment & Plan: 60 y.o. man w/ large L ICH.  -OR today for minimally invasive ICH evac  Jadene Pierini  02/24/2021 7:39 AM

## 2021-03-14 NOTE — Progress Notes (Addendum)
STROKE TEAM PROGRESS NOTE   INTERVAL HISTORY Pt came back from [facility] extubated and CT at 10:40am. He has agonal breathing and not responsive. Right hemiplegia but left also only trace withdraw today. Pupil left 2.22mm and right 73mm, not reactive. I talked with PACU RN that at 9:52am, his pupil 8mm and reactive. His 10:20am CT showed minimal evacuation of the left large hematoma, still significant MLS with mildly increased IVH at left lateral ventricle and aqueduct. Not sure how to explain the right pupil ballooning (false lateralizing sign from uncal herniation vs. Right midbrain stimulation from IVH vs. Right brain new lesion). Will do mannitol stat, intubation and repeat CT stat. I talked with son Loraine Leriche over the phone and he is asking to be the best for the pt at this time and agree with intubation.   Vitals:   03/23/2021 0930 03/04/2021 0937 15-Mar-2021 0945 15-Mar-2021 0952  BP:  (!) 150/85  (!) 158/82  Pulse: 94 89 91 84  Resp: (!) 21 (!) 22 20 18   Temp:    98.4 F (36.9 C)  TempSrc:      SpO2: 100% 100% 100% 100%  Weight:      Height:       CBC:  Recent Labs  Lab 03/13/2021 1125 03/02/2021 1131 03/13/21 1218 03/18/2021 0123  WBC 9.0   < > 9.6 11.5*  NEUTROABS 4.3  --   --   --   HGB 12.4*   < > 13.8 12.5*  HCT 38.4*   < > 43.5 38.0*  MCV 85.3   < > 89.0 85.0  PLT 389   < > 422* 467*   < > = values in this interval not displayed.   Basic Metabolic Panel:  Recent Labs  Lab 03/13/21 1218 03/13/21 1509 03/13/21 1830 03/09/2021 0123  NA 141  --  147* 150*  K 4.8  --   --  3.5  CL 113*  --   --  121*  CO2 16*  --   --  19*  GLUCOSE 132*  --   --  181*  BUN 8  --   --  8  CREATININE 0.83  --   --  0.84  CALCIUM 8.7*  --   --  9.1  MG  --  2.2  --  2.4  PHOS  --  2.5  --  1.4*    Lipid Panel:  Recent Labs  Lab 02/27/2021 1341  CHOL 127  TRIG 138  HDL 45  CHOLHDL 2.8  VLDL 28  LDLCALC 54    HgbA1c:  Recent Labs  Lab 03/09/2021 1341  HGBA1C 6.0*   Urine Drug Screen:  Recent Labs   Lab 03/05/2021 1741  LABOPIA NONE DETECTED  COCAINSCRNUR NONE DETECTED  LABBENZ NONE DETECTED  AMPHETMU NONE DETECTED  THCU NONE DETECTED  LABBARB NONE DETECTED    Alcohol Level  Recent Labs  Lab 03/11/2021 1341  ETH <10    IMAGING past 24 hours DG Abd Portable 1V  Result Date: 03/13/2021 CLINICAL DATA:  NG tube placed EXAM: PORTABLE ABDOMEN - 1 VIEW COMPARISON:  Radiograph 03/12/2021 FINDINGS: There is a nasoenteric feeding catheter with tip overlying the region of the gastric antrum/proximal duodenum. Nonobstructive bowel gas pattern. Multilevel degenerative changes of the spine. No acute osseous abnormality. IMPRESSION: Feeding tube tip overlies the region of the gastric antrum/proximal duodenum. Electronically Signed   By: 03/08/2021 M.D.   On: 03/13/2021 15:54   CT BRAINLAB HEAD W/O CONTRAST (03/23/2021)  Result Date: 02/24/2021 CLINICAL DATA:  Follow-up intraparenchymal hemorrhage EXAM: CT HEAD WITHOUT CONTRAST TECHNIQUE: Contiguous axial images were obtained from the base of the skull through the vertex without intravenous contrast. COMPARISON:  03/01/2021 CT head FINDINGS: Brain: Redemonstrated acute parenchymal hemorrhage centered in the left basal ganglia, overall unchanged in size, measuring 7.0 x 3.7 x 4.1 cm insert dimension. Slightly increased surrounding edema. Possible slight increase in left-to-right midline shift, now 10 mm, previously 9 mm when remeasured similarly. Intraventricular extension, with a small volume of hemorrhage in bilateral lateral lateral, third, and fourth ventricles, overall unchanged. Effacement of the left lateral ventricle and third ventricle, with enlargement of the right lateral ventricle, which is unchanged but concerning for entrapment. Unchanged 3 mm subdural over the left cerebral convexity and along the left tentorium (series 6, image 63). Vascular: No hyperdense vessel. Skull: No acute osseous abnormality. Sinuses/Orbits: Polypoid mucosal thickening  in the left maxillary sinus, which is increased since the prior exam. A nasogastric tube is noted within the nasal cavity and nasopharynx. Other: The mastoids are well aerated. IMPRESSION: Redemonstrated left basal ganglia hemorrhage, overall unchanged in size, with slightly increased surrounding edema and possibly slight increase in left right midline shift, now 9 mm, previously 8 mm. Unchanged size and configuration of the ventricles. Electronically Signed   By: Wiliam Ke M.D.   On: 03/08/2021 02:48    PHYSICAL EXAM  Temp:  [98.4 F (36.9 C)-100.4 F (38 C)] 98.4 F (36.9 C) (10/20 0952) Pulse Rate:  [80-124] 84 (10/20 0952) Resp:  [15-25] 18 (10/20 0952) BP: (109-178)/(64-101) 158/82 (10/20 0952) SpO2:  [96 %-100 %] 100 % (10/20 0952) Arterial Line BP: (143-150)/(81-105) 148/98 (10/20 0952)  General - Well nourished, well developed, respiratory distress with agonal breathing  Ophthalmologic - fundi not visualized due to noncooperation.  Cardiovascular - Regular rhythm and mild tachycardia.  Neuro - respiratory distress, eyes closed, not open on voice, not following commands, nonverbal.  With forced eye opening, eyes in mid position, left pupil 2.5 mm, right pupil 6 mm, nonreactive to light. Not blinking to be described bilaterally.  Sluggish doll's eyes, weak corneal but positive gag and cough.  Mild right facial droop.  Right hemiplegia on pain stimulation, left upper and lower extremity trace withdrawal on pain stimulation. Sensation, coordination and gait not tested.   ASSESSMENT/PLAN Mr. Breylan Lefevers is a 60 y.o. male with history of HTN and ICH approximately 11 years ago while in [place] (had left sided weakness and required PT), presenting acutely from home via EMS as a Code Stroke after family noted him to be acutely nonverbal with right sided weakness.   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 Minimal amount of subarachnoid hemorrhage on the left. 3 mm of midline shift.  CT repeat x 2 showed progressive cerebral edema 46mm MLS MRI large, estimated 57 mL intra-axial hemorrhage centered at the left basal ganglia. Regional edema and mass effect. Subdural versus Subarachnoid hematoma. midline shift up to 10 mm.  Positive also for two punctate acute lacunar infarcts in the left superior frontal lobe. 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 CT head 10/20 minimal evacuation of left BG ICH, continued significant MLS and slightly increased IVH at left lateral ventricle and aqueduct 2D Echo EF 65-70% LDL 54 HgbA1c 6.0 VTE prophylaxis -  scd aspirin 81 mg daily and clopidogrel 75 mg daily prior to admission, now on No antithrombotic due to IPH Therapy recommendations:  pending Disposition: Likely poor prognosis, family requested aggressive care at this time.  Will need further GOC discussion.  Cerebral edema Brain herniation  CT head with 3 mm of midline shift.  CT repeat x 2 showed progressive cerebral edema 33mm MLS MRI large, estimated 57 mL intra-axial hemorrhage centered at the left basal ganglia with midline shift up to 10 mm.  On 3% saline @50 ->75 Neurosurgery on board S/p minimal invasive surgery but only has minimal hematoma evacuation Na 136->138->141->147->150 Na goal 150-155 Now right pupil balloon -> mannitol x1 -> intubation -> repeat CT stat  Hypertensive emergency Home meds:  atenolol, micardis Unstable On cleviprex Put on home atenolol and avapro BP goal < 160 Long-term BP goal normotensive  Hyperlipidemia Home meds:  crestor  LDL 54, goal < 70 Hold off statin for now due to ICH Consider to resume statin at discharge  Dysphagia N.p.o. On core track TF on hold for now for procedure and intubation Will resume TF after  Other  Stroke Risk Factors  hypertension  Hx ICH at right BG 11 years ago with mild residue left weakness   Other Active Problems  Leukocytosis WBC 9.0->14.8->9.6->11.5  Hospital day # 2  This patient is critically ill due to large right BG ICH, IVH, cerebral edema, hypertensive emergency and at significant risk of neurological worsening, death form brain herniation, brain death, hydrocephalus, respiratory failure. This patient's care requires constant monitoring of vital signs, hemodynamics, respiratory and cardiac monitoring, review of multiple databases, neurological assessment, discussion with family, other specialists and medical decision making of high complexity. I spent 50 minutes of neurocritical care time in the care of this patient. I had long discussion with son over the phone, updated pt current condition, treatment plan and potential prognosis, and answered all the questions.  He expressed understanding and appreciation. I also discussed with Dr. , MD PhD Stroke Neurology 03/19/2021 11:20 AM    To contact Stroke Continuity provider, please refer to 03/04/2021. After hours, contact General Neurology

## 2021-03-14 NOTE — Procedures (Signed)
Intubation Procedure Note  Brendan Burgess  [id number]  1960/06/25  Date:03/06/2021  Time:2:17 PM   Provider Performing:Haasini Patnaude N Jamilex Bohnsack    Procedure: Intubation (31500)  Indication(s) Respiratory Failure  Consent Risks of the procedure as well as the alternatives and risks of each were explained to the patient and/or caregiver.  Consent for the procedure was obtained and is signed in the bedside chart   Anesthesia Fentanyl and Propofol   Time Out Verified patient identification, verified procedure, site/side was marked, verified correct patient position, special equipment/implants available, medications/allergies/relevant history reviewed, required imaging and test results available.   Sterile Technique Usual hand hygeine, masks, and gloves were used   Procedure Description Patient positioned in bed supine.  Sedation given as noted above.  Patient was intubated with endotracheal tube using Glidescope.  View was Grade 1 full glottis .  Number of attempts was  2 .  Colorimetric CO2 detector was consistent with tracheal placement.   Complications/Tolerance None; patient tolerated the procedure well. Chest X-ray is ordered to verify placement.   EBL Minimal   Specimen(s) None

## 2021-03-14 NOTE — Progress Notes (Signed)
Transported patient to CT post op and then to 4N ICU room 20. Receiving RN at bedside. No questions or concerns. Vital Signs stable.  Brendan Burgess M

## 2021-03-14 NOTE — Anesthesia Procedure Notes (Signed)
Arterial Line Insertion Start/EndNov 09, 2022 7:05 AM, 2021/04/03 7:15 AM Performed by: Shireen Quan, CRNA, CRNA  Patient location: Pre-op. Preanesthetic checklist: patient identified, IV checked, site marked, risks and benefits discussed, surgical consent, monitors and equipment checked, pre-op evaluation, timeout performed and anesthesia consent Lidocaine 1% used for infiltration Right, radial was placed Catheter size: 20 G Hand hygiene performed , maximum sterile barriers used  and Seldinger technique used  Attempts: 1 Procedure performed without using ultrasound guided technique. Following insertion, dressing applied and Biopatch. Post procedure assessment: normal

## 2021-03-14 NOTE — Progress Notes (Signed)
Spoke with son, Delbert Darley, over the phone.  Consent for ICH evacuation surgery the morning of 03-25-21 was verbally given and verified with two nurses.  Consent form in pt's chart.

## 2021-03-14 NOTE — Progress Notes (Signed)
eLink Physician-Brief Progress Note Patient Name: Brendan Burgess DOB: Jun 19, 1960 MRN: [id number]   Date of Service  Apr 13, 2021  HPI/Events of Note  K and Phos low  eICU Interventions  Ordered KPhos     Intervention Category Minor Interventions: Electrolytes abnormality - evaluation and management  Jacinta Shoe April 13, 2021, 5:04 AM

## 2021-03-14 NOTE — Progress Notes (Signed)
PT Cancellation Note  Patient Details Name: Haydyn Girvan MRN: [id number] DOB: 04/05/1961   Cancelled Treatment:    Reason Eval/Treat Not Completed: Patient at procedure or test/unavailable Patient off unit in OR. PT will re-attempt as time allows.   Graceanne Guin A. Dan Humphreys PT, DPT Acute Rehabilitation Services Pager [phone] Office [phone]    Viviann Spare 03/23/2021, 7:41 AM

## 2021-03-14 NOTE — Anesthesia Procedure Notes (Signed)
Procedure Name: Intubation Date/Time: 03/20/2021 8:02 AM Performed by: Moshe Salisbury, CRNA Pre-anesthesia Checklist: Patient identified, Emergency Drugs available, Suction available and Patient being monitored Patient Re-evaluated:Patient Re-evaluated prior to induction Oxygen Delivery Method: Circle System Utilized Preoxygenation: Pre-oxygenation with 100% oxygen Induction Type: IV induction and Rapid sequence Laryngoscope Size: Mac and 4 Grade View: Grade I Tube type: Oral Tube size: 8.0 mm Number of attempts: 1 Airway Equipment and Method: Stylet Placement Confirmation: ETT inserted through vocal cords under direct vision, positive ETCO2 and breath sounds checked- equal and bilateral Secured at: 23 cm Tube secured with: Tape Dental Injury: Teeth and Oropharynx as per pre-operative assessment

## 2021-03-14 NOTE — Op Note (Signed)
PATIENT: Brendan Burgess  DAY OF SURGERY: 03/08/2021   PRE-OPERATIVE DIAGNOSIS:  Intracerebral hemorrhage   POST-OPERATIVE DIAGNOSIS:  Same   PROCEDURE:  Left craniotomy for evacuation of intracerebral hemorrhage   SURGEON:  Surgeon(s) and Role:    Jadene Pierini, MD - Primary   ANESTHESIA: ETGA   BRIEF HISTORY: This is a 60 year old man who presented with aphasia, weakness, and progressive altered mental status. The patient was found to have a large left basal ganglia hemorrhage with significant midline shift. I therefore recommended a minimally invasive hematoma evacuation. I discussed the procedure at length with the patient's son including risks, benefits, and alternatives and he wished to proceed.   OPERATIVE DETAIL: The patient was taken to the operating room and placed on the OR table in the supine position. A formal time out was performed with two patient identifiers and confirmed the operative site. Anesthesia was induced by the anesthesia team. The Mayfield head holder was applied to the head and a registration array was attached to the [place]. This was co-registered with the patient's preoperative imaging, the fit appeared to be acceptable. Using frameless stereotaxy, the operative trajectory was planned and the incision was marked. Hair was clipped with surgical clippers over the incision and the area was then prepped and draped in a sterile fashion.  A linear incision was placed in the left occipital region. A high speed drill was used to make a burr hole and the dura was opened. Frameless stereotacic navigation was used to guide a blunt probe into the hematoma, followed by an atraumatic suction cannula, which was turned off until it was 2/3 into the hematoma. It was then turned on and hematoma was evacuated while slowly removing the suction cannula. With hematoma evacuation complete, the cannula was used to irrigate the cavity to assess for any ongoing bleeding, which returned clear.  A floseal container was connected to the suction cannula and floseal was lined along the cavity as the suction was removed. Superficial hemostasis was checked and confirmed. The incision was copiously irrigated, all instrument and sponge counts were correct, the incision was then closed in layers. The patient was then returned to anesthesia for emergence. No apparent complications at the completion of the procedure.   EBL:  42mL   DRAINS: none   SPECIMENS: none   Jadene Pierini, MD 03/21/2021 7:29 AM

## 2021-03-14 NOTE — Progress Notes (Signed)
Peripherally Inserted Central Catheter Placement  The IV Nurse has discussed with the patient and/or persons authorized to consent for the patient, the purpose of this procedure and the potential benefits and risks involved with this procedure.  The benefits include less needle sticks, lab draws from the catheter, and the patient may be discharged home with the catheter. Risks include, but not limited to, infection, bleeding, blood clot (thrombus formation), and puncture of an artery; nerve damage and irregular heartbeat and possibility to perform a PICC exchange if needed/ordered by physician.  Alternatives to this procedure were also discussed.  Bard Power PICC patient education guide, fact sheet on infection prevention and patient information card has been provided to patient /or left at bedside.   Consent obtained with son at bedside  PICC Placement Documentation  PICC Triple Lumen 2021/04/11 PICC Left Brachial 39 cm 0 cm (Active)  Indication for Insertion or Continuance of Line Prolonged intravenous therapies 11-Apr-2021 1500  Exposed Catheter (cm) 0 cm Apr 11, 2021 1500  Site Assessment Clean;Dry;Intact 04-11-2021 1500  Lumen #1 Status Flushed;Saline locked;Blood return noted Apr 11, 2021 1500  Lumen #2 Status Flushed;Saline locked;Blood return noted Apr 11, 2021 1500  Lumen #3 Status Flushed;Saline locked;Blood return noted April 11, 2021 1500  Dressing Type Transparent;Securing device 2021/04/11 1500  Dressing Status Clean;Dry;Intact 04/11/2021 1500  Antimicrobial disc in place? Yes 11-Apr-2021 1500  Safety Lock Not Applicable 11-Apr-2021 1500  Line Care Connections checked and tightened 04/11/2021 1500  Dressing Intervention New dressing 2021/04/11 1500  Dressing Change Due 03/21/21 April 11, 2021 1500       Franne Grip Renee 11-Apr-2021, 3:45 PM

## 2021-03-14 NOTE — Progress Notes (Signed)
Pt's decline / pupillar changes / intubation noted, CTH reviewed with reaccumulation of hemorrhage. Given the difficulty with getting hemostasis during the first evacuation and the size of the hemorrhage, I unfortunately do not think that repeat evacuation will be of benefit and likely will lead to further hemorrhage. Unfortunately, I don't think there is anything surgical that would be of benefit at this time.

## 2021-03-14 NOTE — Care Management (Signed)
Letter prepared for patient's daughters, Brendan Burgess and Brendan Burgess, requesting emergency VISA due patient's critical condition.  Letter emailed to [email or web address] as requested.   Quintella Baton, RN, BSN  Trauma/Neuro ICU Case Manager [phone]

## 2021-03-14 NOTE — Anesthesia Postprocedure Evaluation (Signed)
Anesthesia Post Note  Patient: Production assistant, radio  Procedure(s) Performed: Left [facility] Evacuation of Hematoma with Brainlab (Left) APPLICATION OF CRANIAL NAVIGATION (Left: Head)     Patient location during evaluation: PACU Anesthesia Type: General Level of consciousness: confused Pain management: pain level controlled Vital Signs Assessment: post-procedure vital signs reviewed and stable Respiratory status: spontaneous breathing, nonlabored ventilation and respiratory function stable Cardiovascular status: blood pressure returned to baseline and stable Postop Assessment: no apparent nausea or vomiting Anesthetic complications: no   No notable events documented.  Last Vitals:  Vitals:   03-30-2021 1150 2021-03-30 1208  BP: (!) 165/83   Pulse: 86   Resp: 20   Temp: 36.5 C   SpO2: 99% 99%    Last Pain:  Vitals:   03/30/2021 0400  TempSrc: Axillary  PainSc:                  Evett Kassa,W. EDMOND

## 2021-03-14 NOTE — Progress Notes (Addendum)
NAME:  Junah Yam, MRN:  [id number], DOB:  [birth year], LOS: 2 ADMISSION DATE:  03/18/2021, CONSULTATION DATE: 03/07/2021 REFERRING MD: Dr. Otelia Limes - Neuro CHIEF COMPLAINT:  Basal ganglia hemorrhagic stroke    History of Present Illness:  Brendan Burgess is a 60 y.o. male who presented to [facility] ED 10/18 as a Code Stroke due to acute onset aphasia, left-sided gaze and right-sided weakness with facial droop. LKW 0915. PMHx significant for HTN and prior CVA (on ASA).  On ED arrival, patient was taken emergently for CT Head which demonstrated  left basal ganglia hemorrhage (4.7 x 6.1 x 2.4cm). ICH score 3.Of note, patient vomited multiple times in ED/ICU.   Patient was transferred to 4N. Given neurologic status and vomiting, increased concern for airway protection prompted PCCM consult for further assistance in management.  Patient's neurologic imaging (MRI, CT) appeared worsened 10/19PM and NSGY was consulted. On 10/20, patient was taken to OR for minimally-invasive ICH evacuation. Intubated for procedure. Extubated in OR post-procedure. Initially stable in PACU, repeat CT Head completed and transferred back to 4N. On 4N arrival, R pupil blown (37mm vs 2.43mm) and nonreactive. Mannitol given, reintubated due to unstable airway/agonal respirations. Repeat CT Head obtained, pending.  Pertinent Medical History:  Prior CVA on ASA, HTN  Significant Hospital Events:   10/18 - Admitted as code stroke with acute onset aphasia, left gaze, and right sided weakness with facial droop. LKW 0915. Vomited twice PCCM consulted for possible need of airway management  10/19 - MRI Brain with estimated 76mL intra-axial hemorrhage at L basal ganglia (stable) with regional edema/mass effect; trace extra-axial hemorrhage (SDH vs. SAH), stable intraventricular extension of hemorrhage (compressed L lateral ventricle/trapped R lateral ventricle), rightward midline shift 61mm, no herniation; two punctate acute lacunar infarcts L  frontal lobe, chronic encephalomalacia/small vessel disease. 10/20 - OR for minimally-invasive ICH evacuation. Intubated for procedure. Extubated in OR post-procedure. Initially stable in PACU, repeat CT Head completed and transferred to 4N. On 4N arrival, R pupil blown (25mm vs 2.44mm) and nonreactive. Mannitol given, reintubated due to unstable airway/agonal respirations. Repeat CT Head pending.  Interim History / Subjective:  Seen by NSGY 10/19PM who recommended minimally invasive ICH evacuation Underwent evacuation with Dr. Maurice Small this AM Procedure was uncomplicated, tolerated well Intubated for procedure, extubated in OR post-procedure Initially stable in PACU, repeat CT Head completed and transferred to 4N. On 4N arrival, R pupil blown (47mm vs 2.35mm) and nonreactive. Mannitol given, reintubated due to unstable airway/snoring respirations. Repeat CT Head pending.  Objective:  Blood pressure (!) 178/93, pulse (!) 118, temperature 99.4 F (37.4 C), temperature source Axillary, resp. rate (!) 23, height 5\' 2"  (1.575 m), weight 63.8 kg, SpO2 99 %.        Intake/Output Summary (Last 24 hours) at 03/03/2021 0842 Last data filed at 03/18/2021 0600 Gross per 24 hour  Intake 2376.44 ml  Output 4150 ml  Net -1773.56 ml    Filed Weights   02/25/2021 1100  Weight: 63.8 kg   Physical Examination: General: Acutely ill-appearing middle-aged man in NAD. HEENT: Anicteric sclera, R pupil 40mm and nonreactive, L pupil 2.42mm and reactive, moist mucous membranes. Neuro: Comatose. Does not respond to verbal, tactile or noxious stimuli. Not following commands. No spontaneous movement of extremities noted. Flaccid on R. +Cough and +Gag  CV: Tachycardic, regular rhythm no m/g/r. PULM: Breathing even and unlabored on vent (PEEP 5, FiO2 100%). Lung fields clear anteriorly, sounds c/w mechanical ventilation. GI: Soft, nontender, nondistended. Normoactive bowel sounds. Extremities:  No LE edema noted. Skin:  Warm/dry, no rashes.  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 CT Head on admission 10/18 revealed a 4.7 x 6.1 x 2.4cm left basal ganglia hemorrhage. ICH score 3. MRI 10/19 demonstrated large 5mL intra-axial hemorrhage centered in the L basal ganglia with midline shift up to 25mm. Pre-op CT 10/20 demonstrated MLS of 45mm (previously 7mm). Post-op CT showed minimal evacuation of left basal ganglia ICH, ongoing MLS >28mm and increased IVH at L lateral ventricle/aqueduct. R pupil blown post-CT on arrival back to 4N with worsened neurologic status. Concern for possible herniation with false lateralizing sign. - Primary monitoring per Neuro/Stroke teams - S/p minimally invasive ICH evacuation 10/20 (NSGY) - Goal Na 150-155 - Continue hypertonic saline (3%) - Start Mannitol - Repeat CT Head STAT - Continue Cleviprex, goal SBP < 140 - Seizure precautions - Neuroprotective measures: HOB > 30 degrees, normoglycemia, normothermia, electrolytes WNL  Hypertensive emergency History of HTN BP 174/133 on admit. Working with family to determine home medication regimen. - Cleviprex gtt, titrate to goal SBP - Goal SBP < 140 per Neuro - Cardiac monitoring/telemetry    Acute hypoxemic respiratory failure secondary to ICH, decline in neurologic status Given decreased mentation in the setting of ICH and vomiting, patient at risk for inability to protect his airway.  - Continue full vent support (4-8cc/kg IBW) - Wean FiO2 for O2 sat > 90% - Daily WUA/SBT - VAP bundle - Pulmonary hygiene - PAD protocol for sedation: Propofol and Fentanyl for goal RASS 0 to -1  Hyperlipidemia Crestor for home regimen. - Resume statin at discharge  Best Practice:   Diet/type: NPO DVT  prophylaxis: SCD GI prophylaxis: PPI Lines: N/A Foley:  N/A Code Status:  full code Last date of multidisciplinary goals of care discussion: Pending   Critical care time: 50 minutes   Tim Lair, PA-C [facility] 03/24/2021 8:42 AM  Please see [email or web address] for pager details.  From 7A-7P if no response, please call [phone] After hours, please call ELink [phone]

## 2021-03-15 ENCOUNTER — Inpatient Hospital Stay (HOSPITAL_COMMUNITY): Payer: Self-pay

## 2021-03-15 ENCOUNTER — Encounter (HOSPITAL_COMMUNITY): Payer: Self-pay | Admitting: Neurological Surgery

## 2021-03-15 DIAGNOSIS — G911 Obstructive hydrocephalus: Secondary | ICD-10-CM

## 2021-03-15 LAB — BASIC METABOLIC PANEL
BUN: 13 mg/dL (ref 6–20)
CO2: 15 mmol/L — ABNORMAL LOW (ref 22–32)
Calcium: 8.5 mg/dL — ABNORMAL LOW (ref 8.9–10.3)
Chloride: >130 mmol/L (ref 98–111)
Creatinine, Ser: 1.07 mg/dL (ref 0.61–1.24)
GFR, Estimated: >60 mL/min (ref >60)
Glucose, Bld: 250 mg/dL — ABNORMAL HIGH (ref 70–99)
Potassium: 4.9 mmol/L (ref 3.5–5.1)
Sodium: 161 mmol/L (ref 135–145)

## 2021-03-15 LAB — POCT I-STAT 7, (LYTES, BLD GAS, ICA,H+H)
Acid-base deficit: 10 mmol/L — ABNORMAL HIGH (ref 0.0–2.0)
Bicarbonate: 17.7 mmol/L — ABNORMAL LOW (ref 20.0–28.0)
Calcium, Ion: 1.32 mmol/L (ref 1.15–1.40)
HCT: 36 % — ABNORMAL LOW (ref 39.0–52.0)
Hemoglobin: 12.2 g/dL — ABNORMAL LOW (ref 13.0–17.0)
O2 Saturation: 61 %
Patient temperature: 98.8
Potassium: 2.7 mmol/L — CL (ref 3.5–5.1)
Sodium: 170 mmol/L (ref 135–145)
TCO2: 19 mmol/L — ABNORMAL LOW (ref 22–32)
pCO2 arterial: 44 mmHg (ref 32.0–48.0)
pH, Arterial: 7.212 — ABNORMAL LOW (ref 7.350–7.450)
pO2, Arterial: 38 mmHg — CL (ref 83.0–108.0)

## 2021-03-15 LAB — TRIGLYCERIDES
Triglycerides: 1460 mg/dL — ABNORMAL HIGH (ref <150)
Triglycerides: 1586 mg/dL — ABNORMAL HIGH (ref <150)

## 2021-03-15 LAB — CBC
HCT: 34.8 % — ABNORMAL LOW (ref 39.0–52.0)
Hemoglobin: 13 g/dL (ref 13.0–17.0)
MCH: 33.2 pg (ref 26.0–34.0)
MCHC: 37.4 g/dL — ABNORMAL HIGH (ref 30.0–36.0)
MCV: 88.8 fL (ref 80.0–100.0)
Platelets: 463 10*3/uL — ABNORMAL HIGH (ref 150–400)
RBC: 3.92 MIL/uL — ABNORMAL LOW (ref 4.22–5.81)
RDW: 14.6 % (ref 11.5–15.5)
WBC: 14.6 10*3/uL — ABNORMAL HIGH (ref 4.0–10.5)
nRBC: 0 % (ref 0.0–0.2)

## 2021-03-15 LAB — SODIUM
Sodium: 159 mmol/L — ABNORMAL HIGH (ref 135–145)
Sodium: 162 mmol/L (ref 135–145)
Sodium: 165 mmol/L (ref 135–145)

## 2021-03-15 LAB — GLUCOSE, CAPILLARY
Glucose-Capillary: 207 mg/dL — ABNORMAL HIGH (ref 70–99)
Glucose-Capillary: 200 mg/dL — ABNORMAL HIGH (ref 70–99)
Glucose-Capillary: 160 mg/dL — ABNORMAL HIGH (ref 70–99)
Glucose-Capillary: 167 mg/dL — ABNORMAL HIGH (ref 70–99)
Glucose-Capillary: 159 mg/dL — ABNORMAL HIGH (ref 70–99)

## 2021-03-15 LAB — MAGNESIUM: Magnesium: 2.5 mg/dL — ABNORMAL HIGH (ref 1.7–2.4)

## 2021-03-15 LAB — PHOSPHORUS: Phosphorus: 1.1 mg/dL — ABNORMAL LOW (ref 2.5–4.6)

## 2021-03-15 MED ORDER — NOREPINEPHRINE 4 MG/250ML-% IV SOLN
0.0000 ug/min | INTRAVENOUS | Status: DC
  Administered 2021-03-15: 10 ug/min via INTRAVENOUS
  Filled 2021-03-15: qty 250

## 2021-03-15 MED ORDER — SODIUM PHOSPHATES 45 MMOLE/15ML IV SOLN
45.0000 mmol | Freq: Once | INTRAVENOUS | Status: AC
  Administered 2021-03-15: 45 mmol via INTRAVENOUS
  Filled 2021-03-15: qty 15

## 2021-03-15 MED ORDER — SODIUM CHLORIDE 0.9 % IV BOLUS: 500.0000 mL | Freq: Once | INTRAVENOUS | Status: DC

## 2021-03-15 MED ORDER — PANTOPRAZOLE 2 MG/ML SUSPENSION: 40.0000 mg | Freq: Every day | ORAL | Status: DC

## 2021-03-26 NOTE — Progress Notes (Signed)
Nutrition Follow-up  DOCUMENTATION CODES:   Not applicable  INTERVENTION:   Tube feeding via Cortrak tube: Osmolite 1.2 at 60 ml/h (1440 ml per day) Prosource TF 45 ml daily  Provides 1768 kcal, 91 gm protein, 1181 ml free water daily  Phosphorus 1.1, reached out to CCM PA who will place orders to replace.   NUTRITION DIAGNOSIS:   Inadequate oral intake related to inability to eat as evidenced by NPO status. Ongoing.  GOAL:   Patient will meet greater than or equal to 90% of their needs Met with TF.   MONITOR:   TF tolerance, Diet advancement, Labs, Weight trends  REASON FOR ASSESSMENT:   Consult Enteral/tube feeding initiation and management  ASSESSMENT:   Admitted to hospital 10/18 for R sided weakness secondary to stroke. PMH includes HTN and stroke 11 years ago.  Pt discussed during ICU rounds and with RN. Pt had crani 10/20 then had a decline with reaccumulation of hemorrhage. Per neurology pt with poor prognosis awaiting family from [place]. Phosphorus being replaced.   10/19 cortrak placed; tip gastric   10/20 s/p L craniotomy for evacuation of ICH; remains on vent  Patient is currently intubated on ventilator support MV: 11.6 L/min Temp (24hrs), Avg:100.6 F (38.1 C), Min:99.4 F (37.4 C), Max:103 F (39.4 C)  Medications reviewed and include: SSI, protonix, senokot-s Phenergan Hypertonic saline  Cleviprex @ 64 ml/hr provides: 3072 kcal   Labs reviewed: Na 161 (3%), Cl >130, PO4 1.1, TG: 1586 CBG's: 160-207  UOP: 4000 ml   Diet Order:   Diet Order             Diet NPO time specified  Diet effective now                   EDUCATION NEEDS:   Not appropriate for education at this time  Skin:  Skin Assessment: Reviewed RN Assessment  Last BM:  unknown  Height:   Ht Readings from Last 1 Encounters:  03/13/21 '5\' 2"'  (1.575 m)    Weight:   Wt Readings from Last 1 Encounters:  03-29-21 60.3 kg    BMI:  Body mass index is  24.31 kg/m.  Estimated Nutritional Needs:   Kcal:  1700-1900  Protein:  85-95g  Fluid:  >1.7L  Jdyn Parkerson P., RD, LDN, CNSC See AMiON for contact information

## 2021-03-26 NOTE — Progress Notes (Signed)
Na at 0600 is 161, 3% stopped at this time.

## 2021-03-26 NOTE — Progress Notes (Signed)
eLink Physician-Brief Progress Note Patient Name: Brendan Burgess DOB: [birth year] MRN: [id number]   Date of Service  04/12/21  HPI/Events of Note  Multiple issues: 1. Hypotension - BP = 67/42. Cerebral herniation? And 2. Hypoxia - Now on 50% with sat in high 80's.  eICU Interventions  Plan: Norepinephrine IV infusion. Titrate to MAP >= 65.  Increase PEEP to 8. Portable CXR STAT.     Intervention Category Major Interventions: Respiratory failure - evaluation and management;Hypoxemia - evaluation and management;Hypotension - evaluation and management  Lenell Antu 12-Apr-2021, 9:02 PM

## 2021-03-26 NOTE — Death Summary Note (Signed)
DEATH SUMMARY   Patient Details  Name: Brendan Burgess MRN: [id number] DOB: 1960/12/16  Admission/Discharge Information   Admit Date:  03-30-2021  Date of Death: Date of Death: April 02, 2021  Time of Death: Time of Death: 09-26-2303  Length of Stay: 4  Referring Physician: No primary care provider on file.   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 year old male with history of HTN and ICH approximately 11 years ago while in [place] (had left sided weakness and required PT), presenting acutely from home via EMS as a Code Stroke after family noted him to be acutely nonverbal with right sided weakness.    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 Minimal amount of subarachnoid hemorrhage on the left. 3 mm of midline shift.  CT repeat x 2 showed progressive cerebral edema 44mm MLS MRI large, estimated 57 mL intra-axial hemorrhage centered at the left basal ganglia. Regional edema and mass effect. Subdural versus Subarachnoid hematoma. midline shift up to 10 mm.  Positive also for two punctate acute lacunar infarcts in the left superior frontal lobe. 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 CT head 10/20 x 2 s/p evacuation of left BG ICH but with likely re-bleeding, continued significant MLS and slightly increased IVH at left lateral  ventricle and aqueduct 2D Echo EF 65-70% LDL 54 HgbA1c 6.0 Was on aspirin 81 mg daily and clopidogrel 75 mg daily prior to admission Deposition - pt expired on 04-02-21 09/26/03   Cerebral edema Brain herniation  CT head with 3 mm of midline shift.  CT repeat x 2 showed progressive cerebral edema 42mm MLS MRI large, estimated 57 mL intra-axial hemorrhage centered at the left basal ganglia with midline shift up to 10 mm.  On 3% saline @50 ->75->off Neurosurgery on board Na 136->138->141->147->150->159->161 CT head 10/20 x 2 s/p evacuation of left BG ICH but with likely re-bleeding, continued significant MLS and slightly increased IVH at left lateral ventricle and aqueduct   Hypertensive emergency Home meds:  atenolol, micardis Unstable Was On cleviprex Was on home atenolol and avapro   Hyperlipidemia Home meds:  crestor  LDL 54    Dysphagia N.p.o. Was On core track Was On TF @62    Other Stroke Risk Factors Hx ICH at right BG 11 years ago with mild residue left weakness    Other Active Problems  Leukocytosis WBC 9.0->14.8->9.6->11.5->14.6    Pertinent Labs and Studies  Significant Diagnostic Studies CT HEAD WO CONTRAST (11/20)  Result Date: 02/23/2021 CLINICAL DATA:  Cerebral hemorrhage suspected EXAM: CT HEAD WITHOUT CONTRAST TECHNIQUE: Contiguous axial images were obtained from the base of the skull through the vertex without intravenous contrast. COMPARISON:  CT head from the same day. FINDINGS: Brain: Redemonstrated postoperative change with tract extending from the area parenchymal hemorrhage posteriorly acute hemorrhage in this region (for example series 3, image  19). Similar gas in this region. The size of acute hemorrhage along this tract has mildly increased. No substantial change in large intraparenchymal hemorrhage in the left cerebral hemisphere, measuring approximately 7.0 x 4 cm (previously 7.3 x 4 cm). Similar surrounding edema and similar versus slightly increased  rightward midline shift, measuring approximately 1.5 cm (previously 1.4 cm). Similar hemorrhage within the lateral left temporal lobe. Similar left cerebral convexity sulcal effacement and effacement of the suprasellar cistern. Similar intraventricular hemorrhage. Similar enlargement the right lateral ventricle with rounding of the right temporal horn, compatible with ventricular entrapment. No evidence of acute large vascular territory infarct. Similar sulcal effacement on the left. Vascular: No hyperdense vessel identified. Skull: No acute fracture.  Left posterior burr hole. Sinuses/Orbits: Inferior left maxillary sinus mucosal thickening. Other: No mastoid effusions. IMPRESSION: 1. Redemonstrated postoperative change with increased hemorrhage along the tract posterior to the dominant intraparenchymal hemorrhage (for example series 3, image 19). Otherwise, similar size of the large parenchymal hemorrhage with similar versus slightly increased rightward midline shift (1.5 cm versus 1.4 cm). 2. Persistent enlargement of the right lateral ventricle, compatible with ventricular entrapment. Similar intraventricular hemorrhage. Electronically Signed   By: Feliberto Harts M.D.   On: 2021/03/28 14:52   CT HEAD WO CONTRAST ( )  Result Date: 28-Mar-2021 CLINICAL DATA:  Intracranial hemorrhage, follow-up EXAM: CT HEAD WITHOUT CONTRAST TECHNIQUE: Contiguous axial images were obtained from the base of the skull through the vertex without intravenous contrast. COMPARISON:  Earlier same day FINDINGS: Brain: New postoperative changes with tract extending to the area of parenchymal hemorrhage from a posterior approach with air and acute hemorrhage. Size of parenchymal hemorrhage remains large measuring 7.3 x 4 cm (previously 6.9 x 3.7 cm). Similar surrounding edema. Remains mass effect on the adjacent ventricles with subfalcine herniation. Rightward midline shift measures 1.4 cm (previously 1 cm). There is decreased  effacement of the left lateral ventricle. Persistent trapping of the right lateral ventricle. Intraventricular hemorrhage is again noted and has increased. There is new hyperdense hemorrhage within the lateral left temporal lobe (series 3, image 15). Trace subdural hemorrhage remains present along the left parietal convexity (series 5, image 38). Persistent low density within the pons may be artifactual or reflect edema. Vascular: No new finding. Skull: New very small posterior left parietal craniotomy. Sinuses/Orbits: No acute finding. Other: None. IMPRESSION: Interval postoperative changes with new hemorrhage and air along the tract. Size of parenchymal hemorrhage remains large and measures slightly greater with similar surrounding edema. Rightward midline shift measures greater at 1.4 cm versus 1 cm with persistent trapping of the right lateral ventricle. Increased intraventricular hemorrhage. New hyperdense hemorrhage within the lateral left temporal lobe. These results will be called to the ordering clinician or representative by the Radiologist Assistant, and communication documented in the PACS or [employer]. Electronically Signed   By: Guadlupe Spanish M.D.   On: Mar 28, 2021 12:50   CT HEAD WO CONTRAST ( )  Result Date: 02/26/2021 CLINICAL DATA:  Follow-up intracranial hemorrhage. EXAM: CT HEAD WITHOUT CONTRAST TECHNIQUE: Contiguous axial images were obtained from the base of the skull through the vertex without intravenous contrast. COMPARISON:  Head CT 03/11/2021 at 2:43 p.m. FINDINGS: Brain: An acute parenchymal hemorrhage centered in the left basal ganglia is unchanged in size, measuring 7.0 x 3.7 x 4.1 cm. Intraventricular extension is again noted with small volume hemorrhage in the lateral, third and fourth ventricles, overall similar in volume to the prior CT with slight interval redistribution. Edema surrounding the left basal  ganglia hemorrhage is unchanged, as is rightward midline shift  measuring 8 mm. Effacement of the lateral and third ventricles and mild dilatation of the right lateral ventricle suggestive of trapping are unchanged. A trace subdural hematoma over the left cerebral convexity and along the left tentorium is unchanged, measuring 3 mm in maximal thickness. No new intracranial hemorrhage or acute cortically based infarct is identified. Hypodensities in the cerebral white matter bilaterally are unchanged and nonspecific but compatible with mild chronic small vessel ischemic disease. Vascular: Calcified atherosclerosis at the skull base. No hyperdense vessel. Skull: No fracture or suspicious osseous lesion. Sinuses/Orbits: Mild mucosal thickening in the left maxillary sinus. Clear mastoid air cells. Right cataract extraction. Other: None. IMPRESSION: 1. Unchanged left basal ganglia hemorrhage with intraventricular extension and 8 mm of rightward midline shift. 2. Unchanged trace left-sided subdural hematoma. 3. No evidence of new intracranial abnormality. Electronically Signed   By: Sebastian Ache M.D.   On: 2021/03/29 20:58   CT HEAD WO CONTRAST ( )  Result Date: Mar 29, 2021 CLINICAL DATA:  Follow-up intracranial hemorrhage. EXAM: CT HEAD WITHOUT CONTRAST TECHNIQUE: Contiguous axial images were obtained from the base of the skull through the vertex without intravenous contrast. COMPARISON:  2021/03/29 at 11:32 a.m. FINDINGS: Brain: An acute left basal ganglia hemorrhage has mildly enlarged and now measures 7.0 x 3.7 x 4.1 cm (AP x transverse x craniocaudal, estimated volume of 53 mL). There is new intraventricular extension with a small amount of hemorrhage in the left lateral, third, and fourth ventricles. Mild edema surrounding the left basal ganglia hemorrhage is similar to the prior study, however rightward midline shift has increased and now measures 8 mm. The left lateral and third ventricles are effaced, and mild dilatation of the right lateral ventricle has slightly  increased. A trace subdural hematoma over the left cerebral convexity measuring up to 3 mm in thickness was not clearly present on the prior study. No acute cortically based infarct is identified. Hypodensities elsewhere in the cerebral white matter bilaterally are unchanged and nonspecific but compatible with mild chronic small vessel ischemic disease. Vascular: Calcified atherosclerosis at the skull base. Skull: No fracture or suspicious osseous lesion. Sinuses/Orbits: Mild, partially polypoid mucosal thickening in the left maxillary sinus. Clear mastoid air cells. Right cataract extraction. Other: None. IMPRESSION: 1. Mildly increased size of left basal ganglia hemorrhage with new intraventricular extension. 2. Increased rightward midline shift, now 8 mm. Slightly increased dilatation of the right lateral ventricle which may reflect trapping. 3. New trace subdural hematoma over the left cerebral convexity. Electronically Signed   By: Sebastian Ache M.D.   On: 2021/03/29 16:57   MR BRAIN WO CONTRAST  Result Date: 03/13/2021 CLINICAL DATA:  60 year old male code stroke presentation with left hemisphere intra-axial hemorrhage. EXAM: MRI HEAD WITHOUT CONTRAST TECHNIQUE: Multiplanar, multiecho pulse sequences of the brain and surrounding structures were obtained without intravenous contrast. COMPARISON:  CT of the head 2021/03/29. FINDINGS: Brain: T2 heterogeneous but mostly hyperintense and T1 mostly isointense intra-axial hemorrhage centered at the left basal ganglia encompasses 65 x 42 by 42 mm (AP by transverse by CC) for an estimated blood volume of 57 mL, not significantly changed from 1443 hours yesterday. Regional edema and mass effect. No overt uncal herniation at this time. Furthermore there is a subtle left posterior convexity subdural or less likely subarachnoid hemorrhage best seen on series 11, image 18. Subsequent rightward midline shift of up to 10 mm. Intraventricular extension of blood, relatively  small volume including trace layering in  the occipital horns. Effaced left lateral ventricle. And the right lateral ventricle appears mildly trapped with possible transependymal edema as seen on series 11, image 15. Basilar cisterns remain patent. Encephalomalacia in the right hemisphere appears related to remote hemorrhage in the right external capsule or lentiform as seen on series 11, image 14 and series 14, image 27. Chronic microhemorrhage also suspected in the left deep cerebellar nuclei. Subtle chronic lacunar infarct of the left PICA territory. No definite cortical encephalomalacia. DWI susceptibility associated with the blood products. Additionally, 2 small foci of cortical or subcortical white matter restricted diffusion are noted in the left superior frontal lobe on series 5 images 94 and 97. But no other convincing ischemic infarct. Cervicomedullary junction and pituitary are within normal limits. Vascular: Major intracranial vascular flow voids are preserved. There is mild generalized intracranial artery tortuosity. Skull and upper cervical spine: Negative visible cervical spine, bone marrow signal. Sinuses/Orbits: Postoperative changes to the right globe. Mild to moderate left maxillary sinus mucosal thickening. Other: Mastoids are clear. IMPRESSION: 1. Relatively large, estimated 57 mL intra-axial hemorrhage centered at the left basal ganglia, stable since 1443 hours yesterday. Regional edema and mass effect. 2. Also there is trace extra-axial hemorrhage along the left posterior convexity, Subdural versus Subarachnoid hematoma. 3. Stable intraventricular extension of hemorrhage. Compressed left lateral ventricle and trapped right lateral ventricle with perhaps mild transependymal edema. 4. Subsequent rightward midline shift up to 10 mm. Basilar cisterns remain patent. No uncal herniation at this time. 5. Positive also for two punctate acute lacunar infarcts in the left superior frontal lobe. 6. And  chronic hemorrhage and encephalomalacia at the right external capsule, chronic small vessel disease in the left cerebellum. Electronically Signed   By: Odessa Fleming M.D.   On: 03/13/2021 07:31   DG CHEST PORT 1 VIEW  Result Date: 2021/04/09 CLINICAL DATA:  Hypoxia, intubated EXAM: PORTABLE CHEST 1 VIEW COMPARISON:  02/25/2021 FINDINGS: Single frontal view of the chest demonstrates endotracheal tube overlying tracheal air column, tip approximate 2.3 cm above carina. Enteric catheter passes below diaphragm tip excluded by collimation. Left-sided PICC tip overlies the superior vena cava. The cardiac silhouette is unremarkable. There is improved aeration at the medial right lung base, with persistent hazy airspace disease. No effusion or pneumothorax. No acute bony abnormalities. IMPRESSION: 1. Improved aeration at the right lung base, with persistent right basilar consolidation consistent with resolving atelectasis or airspace disease. 2. Support devices as above. Electronically Signed   By: Sharlet Salina M.D.   On: 2021/04/09 21:29   DG Chest Port 1 View  Result Date: 03/13/2021 CLINICAL DATA:  Reason for exam: hx of ETT, code stroke. Pt non-verbal EXAM: PORTABLE CHEST - 1 VIEW COMPARISON:  03/03/2021 and previous FINDINGS: Endotracheal tube is been placed 3.2 cm above carina. Weighted tip feeding tube extends to the pylorus. Patchy right infrahilar atelectasis or infiltrate. Left lung clear. Heart size and mediastinal contours are within normal limits. Aortic Atherosclerosis (ICD10-170.0). No effusion. Visualized bones unremarkable. IMPRESSION: 1. Good position of endotracheal tube and feeding tube. 2. Mild patchy right infrahilar atelectasis or infiltrate Electronically Signed   By: Corlis Leak M.D.   On: 02/26/2021 12:47   DG Chest Port 1 View  Result Date: 02/26/2021 CLINICAL DATA:  Stroke EXAM: PORTABLE CHEST 1 VIEW COMPARISON:  None. FINDINGS: The heart size and mediastinal contours are within normal  limits. Atherosclerotic calcification of the aortic knob. Minimal streaky opacity within the left lung base. Lungs are otherwise clear. No pleural  effusion or pneumothorax. The visualized skeletal structures are unremarkable. IMPRESSION: Minimal streaky opacity within the left lung base, likely atelectasis. Lungs otherwise clear. Electronically Signed   By: Duanne Guess D.O.   On: 03/07/2021 14:48   DG Abd Portable 1V  Result Date: 03/13/2021 CLINICAL DATA:  NG tube placed EXAM: PORTABLE ABDOMEN - 1 VIEW COMPARISON:  Radiograph 03/04/2021 FINDINGS: There is a nasoenteric feeding catheter with tip overlying the region of the gastric antrum/proximal duodenum. Nonobstructive bowel gas pattern. Multilevel degenerative changes of the spine. No acute osseous abnormality. IMPRESSION: Feeding tube tip overlies the region of the gastric antrum/proximal duodenum. Electronically Signed   By: Caprice Renshaw M.D.   On: 03/13/2021 15:54   ECHOCARDIOGRAM COMPLETE  Result Date: 03/13/2021    ECHOCARDIOGRAM REPORT   Patient Name:   [facility] Date of Exam: 03/13/2021 Medical Rec #:  [id number]  Height:       62.0 in Accession #:    [id number] Weight:       140.7 lb Date of Birth:  10/17/1960  BSA:          1.646 m Patient Age:    60 years   BP:           129/76 mmHg Patient Gender: M          HR:           98 bpm. Exam Location:  Inpatient Procedure: 2D Echo, Color Doppler and Cardiac Doppler Indications:     Stroke  History:         Patient has no prior history of Echocardiogram examinations.                  Risk Factors:Hypertension.  Sonographer:     Thelma Barge Sonographer#2:   Alvera Novel Referring Phys:  [id number] ERIC LINDZEN Diagnosing Phys: Lavona [place]ound Tobb DO IMPRESSIONS  1. Left ventricular ejection fraction, by estimation, is 65 to 70%. The left ventricle has normal function. The left ventricle has no regional wall motion abnormalities. Left ventricular diastolic parameters were normal.  2. Right ventricular systolic  function is normal. The right ventricular size is normal. There is normal pulmonary artery systolic pressure.  3. The mitral valve is normal in structure. No evidence of mitral valve regurgitation. No evidence of mitral stenosis.  4. The aortic valve is tricuspid. Aortic valve regurgitation is not visualized. Mild aortic valve sclerosis is present, with no evidence of aortic valve stenosis.  5. The inferior vena cava is normal in size with greater than 50% respiratory variability, suggesting right atrial pressure of 3 mmHg. FINDINGS  Left Ventricle: Left ventricular ejection fraction, by estimation, is 65 to 70%. The left ventricle has normal function. The left ventricle has no regional wall motion abnormalities. The left ventricular internal cavity size was normal in size. There is  no left ventricular hypertrophy. Left ventricular diastolic parameters were normal. Right Ventricle: The right ventricular size is normal. No increase in right ventricular wall thickness. Right ventricular systolic function is normal. There is normal pulmonary artery systolic pressure. The tricuspid regurgitant velocity is 2.80 m/s, and  with an assumed right atrial pressure of 3 mmHg, the estimated right ventricular systolic pressure is 34.4 mmHg. Left Atrium: Left atrial size was normal in size. Right Atrium: Right atrial size was normal in size. Pericardium: There is no evidence of pericardial effusion. Mitral Valve: The mitral valve is normal in structure. No evidence of mitral valve regurgitation. No evidence of mitral valve stenosis. Tricuspid Valve: The  tricuspid valve is normal in structure. Tricuspid valve regurgitation is trivial. No evidence of tricuspid stenosis. Aortic Valve: The aortic valve is tricuspid. Aortic valve regurgitation is not visualized. Mild aortic valve sclerosis is present, with no evidence of aortic valve stenosis. Pulmonic Valve: The pulmonic valve was not well visualized. Pulmonic valve regurgitation is  not visualized. No evidence of pulmonic stenosis. Aorta: The aortic root is normal in size and structure. Venous: The inferior vena cava is normal in size with greater than 50% respiratory variability, suggesting right atrial pressure of 3 mmHg. IAS/Shunts: No atrial level shunt detected by color flow Doppler.  LEFT VENTRICLE PLAX 2D LVIDd:         4.40 cm     Diastology LVIDs:         2.80 cm     LV e' medial:    8.87 cm/s LV PW:         0.70 cm     LV E/e' medial:  9.1 LV IVS:        0.80 cm     LV e' lateral:   15.30 cm/s LVOT diam:     1.70 cm     LV E/e' lateral: 5.3 LV SV:         60 LV SV Index:   37 LVOT Area:     2.27 cm  LV Volumes (MOD) LV vol d, MOD A2C: 64.7 ml LV vol d, MOD A4C: 69.1 ml LV vol s, MOD A2C: 23.0 ml LV vol s, MOD A4C: 26.3 ml LV SV MOD A2C:     41.7 ml LV SV MOD A4C:     69.1 ml LV SV MOD BP:      42.1 ml RIGHT VENTRICLE             IVC RV S prime:     19.00 cm/s  IVC diam: 1.40 cm TAPSE (M-mode): 2.5 cm LEFT ATRIUM             Index        RIGHT ATRIUM           Index LA diam:        2.90 cm 1.76 cm/m   RA Area:     11.80 cm LA Vol (A2C):   34.0 ml 20.66 ml/m  RA Volume:   23.50 ml  14.28 ml/m LA Vol (A4C):   23.7 ml 14.40 ml/m LA Biplane Vol: 31.2 ml 18.96 ml/m  AORTIC VALVE LVOT Vmax:   169.00 cm/s LVOT Vmean:  112.000 cm/s LVOT VTI:    0.266 m  AORTA Ao Root diam: 3.40 cm Ao Asc diam:  3.30 cm MITRAL VALVE                TRICUSPID VALVE MV Area (PHT): 6.83 cm     TR Peak grad:   31.4 mmHg MV Decel Time: 111 msec     TR Vmax:        280.00 cm/s MV E velocity: 80.97 cm/s MV A velocity: 103.00 cm/s  SHUNTS MV E/A ratio:  0.79         Systemic VTI:  0.27 m                             Systemic Diam: 1.70 cm Kardie Tobb DO Electronically signed by Thomasene Ripple DO Signature Date/Time: 03/13/2021/1:12:59 PM    Final    CT HEAD CODE STROKE WO CONTRAST  Result Date: 03/17/2021 CLINICAL DATA:  Code stroke.  Left-sided gaze. EXAM: CT HEAD WITHOUT CONTRAST TECHNIQUE: Contiguous axial  images were obtained from the base of the skull through the vertex without intravenous contrast. COMPARISON:  None. FINDINGS: Brain: A left basal ganglia and external capsule hemorrhage measures 4.7 x 6.1 x 2.4 cm. Minimal amount subarachnoid hemorrhage is present on the left. No definite intraventricular hemorrhage is present. No hydrocephalus is present. 3 mm of midline shift is present. Mass effect effaces the left lateral ventricle. There is effacement of the sulci over the left convexity. Moderate diffuse white matter disease is present. Remote nonhemorrhagic lacunar infarcts are present in the right thalamus. Brainstem and cerebellum are within normal limits. Vascular: Atherosclerotic calcifications are present within the cavernous internal carotid arteries. No hyperdense vessel is present. Skull: Calvarium is intact. No focal lytic or blastic lesions are present. Left supraorbital scalp soft tissue swelling is present without underlying fracture. Sinuses/Orbits: The paranasal sinuses and mastoid air cells are clear. Bilateral lens replacements are noted. Globes and orbits are otherwise unremarkable. IMPRESSION: 1. 4.7 x 6.1 x 2.4 cm left basal ganglia and external capsule hemorrhage. 2. Minimal amount of subarachnoid hemorrhage on the left. 3. Mass effect effaces the left lateral ventricle with 3 mm of midline shift. 4. Left supraorbital scalp soft tissue swelling without underlying fracture. 5. Moderate diffuse white matter disease likely reflects the sequela of chronic microvascular ischemia. The above was relayed via text pager to Dr. Otelia Limes on 03/23/2021 at 11:36 . Electronically Signed   By: Marin Roberts M.D.   On: 03/01/2021 11:41   [place] EKG SITE RITE  Result Date: 04/09/21 If Site Rite image not attached, placement could not be confirmed due to current cardiac rhythm.  CT BRAINLAB HEAD W/O CONTRAST ( )  Result Date: 2021/04/09 CLINICAL DATA:  Follow-up intraparenchymal hemorrhage  EXAM: CT HEAD WITHOUT CONTRAST TECHNIQUE: Contiguous axial images were obtained from the base of the skull through the vertex without intravenous contrast. COMPARISON:  03/13/2021 CT head FINDINGS: Brain: Redemonstrated acute parenchymal hemorrhage centered in the left basal ganglia, overall unchanged in size, measuring 7.0 x 3.7 x 4.1 cm insert dimension. Slightly increased surrounding edema. Possible slight increase in left-to-right midline shift, now 10 mm, previously 9 mm when remeasured similarly. Intraventricular extension, with a small volume of hemorrhage in bilateral lateral lateral, third, and fourth ventricles, overall unchanged. Effacement of the left lateral ventricle and third ventricle, with enlargement of the right lateral ventricle, which is unchanged but concerning for entrapment. Unchanged 3 mm subdural over the left cerebral convexity and along the left tentorium (series 6, image 63). Vascular: No hyperdense vessel. Skull: No acute osseous abnormality. Sinuses/Orbits: Polypoid mucosal thickening in the left maxillary sinus, which is increased since the prior exam. A nasogastric tube is noted within the nasal cavity and nasopharynx. Other: The mastoids are well aerated. IMPRESSION: Redemonstrated left basal ganglia hemorrhage, overall unchanged in size, with slightly increased surrounding edema and possibly slight increase in left right midline shift, now 9 mm, previously 8 mm. Unchanged size and configuration of the ventricles. Electronically Signed   By: Wiliam Ke M.D.   On: 04-09-21 02:48    Microbiology Recent Results (from the past 240 hour(s))  MRSA Next Gen by PCR, Nasal     Status: Abnormal   Collection Time: 03/02/2021  1:05 PM   Specimen: Nasal Mucosa; Nasal Swab  Result Value Ref Range Status   MRSA by PCR Next Gen DETECTED (A) NOT DETECTED Final  Comment: RESULT CALLED TO, READ BACK BY AND VERIFIED WITHLeata Mouse RN [phone] 04/11/21 A BROWNING (NOTE) The GeneXpert MRSA  Assay (FDA approved for NASAL specimens only), is one component of a comprehensive MRSA colonization surveillance program. It is not intended to diagnose MRSA infection nor to guide or monitor treatment for MRSA infections. Test performance is not FDA approved in patients less than 84 years old. Performed at [facility], [address], [place], [place] [address]   Surgical pcr screen     Status: Abnormal   Collection Time: 03/13/21 11:54 PM   Specimen: Nasal Mucosa; Nasal Swab  Result Value Ref Range Status   MRSA, PCR POSITIVE (A) NEGATIVE Final    Comment: RESULT CALLED TO, READ BACK BY AND VERIFIED WITH: RN TAYLOR P. 03/21/2021@1 :55 BY TW    Staphylococcus aureus POSITIVE (A) NEGATIVE Final    Comment: (NOTE) The Xpert SA Assay (FDA approved for NASAL specimens in patients 36 years of age and older), is one component of a comprehensive surveillance program. It is not intended to diagnose infection nor to guide or monitor treatment. Performed at [facility], [address], [place], [place] [address]     Lab Basic Metabolic Panel: Recent Labs  Lab 04/11/21 1125 04/11/21 1131 2021-04-11 1341 03/13/21 0722 03/13/21 1218 03/13/21 1509 03/13/21 1830 03/04/2021 0123 03/04/2021 1319 03/10/2021 1343 02/23/2021 1728 03/04/2021 0021 03/18/2021 0500 02/24/2021 1221 03/13/2021 1745 03/10/2021 2129  NA 134* 136   < >  --  141  --    < > 150*   < > 154* 153* 159* 161* 162* 165* 170*  K 3.8 3.7  --   --  4.8  --   --  3.5  --  3.5  --   --  4.9  --   --  2.7*  CL 103 101  --   --  113*  --   --  121*  --   --   --   --  >130*  --   --   --   CO2 21*  --   --   --  16*  --   --  19*  --   --   --   --  15*  --   --   --   GLUCOSE 125* 121*  --   --  132*  --   --  181*  --   --   --   --  250*  --   --   --   BUN 11 12  --   --  8  --   --  8  --   --   --   --  13  --   --   --   CREATININE 0.93 0.90  --   --  0.83  --   --  0.84  --   --   --   --  1.07  --   --   --   CALCIUM 8.9   --   --   --  8.7*  --   --  9.1  --   --   --   --  8.5*  --   --   --   MG  --   --   --  2.1  --  2.2  --  2.4  --   --  2.6*  --  2.5*  --   --   --  PHOS  --   --   --   --   --  2.5  --  1.4*  --   --  1.4*  --  1.1*  --   --   --    < > = values in this interval not displayed.   Liver Function Tests: Recent Labs  Lab 03/23/2021 1125  AST 23  ALT 9  ALKPHOS 16*  BILITOT 0.7  PROT 7.2  ALBUMIN 3.7   No results for input(s): LIPASE, AMYLASE in the last 168 hours. No results for input(s): AMMONIA in the last 168 hours. CBC: Recent Labs  Lab 03/24/2021 1125 03/23/2021 1131 02/25/2021 1341 03/13/21 1218 02/26/2021 0123 03/18/2021 1343 02/28/2021 0500 03/06/2021 2129  WBC 9.0  --  14.8* 9.6 11.5*  --  14.6*  --   NEUTROABS 4.3  --   --   --   --   --   --   --   HGB 12.4*   < > 13.0 13.8 12.5* 11.6* 13.0 12.2*  HCT 38.4*   < > 39.0 43.5 38.0* 34.0* 34.8* 36.0*  MCV 85.3  --  83.2 89.0 85.0  --  88.8  --   PLT 389  --  413* 422* 467*  --  463*  --    < > = values in this interval not displayed.   Cardiac Enzymes: No results for input(s): CKTOTAL, CKMB, CKMBINDEX, TROPONINI in the last 168 hours. Sepsis Labs: Recent Labs  Lab 03/09/2021 1341 03/13/21 1218 03/08/2021 0123 03/20/2021 0500  WBC 14.8* 9.6 11.5* 14.6*    Procedures/Operations  S/p minimal invasive hematoma evacuation  Intubation    Marvel Plan 29-Mar-2021, 7:12 AM

## 2021-03-26 NOTE — Progress Notes (Signed)
Pt now a DNR, family at bedside.   At 2305, pt went asystole on the monitor. Aliviya Schoeller RN and Automotive engineer confirmed by auscultating heart and lungs sounds. MD notified. Family at bedside.

## 2021-03-26 NOTE — Progress Notes (Signed)
Upon assessment at 1745, noted acute L pupillary change from 90mm nonreactive to 62mm nonreactive and irregular. Dr. Roda Shutters notified.

## 2021-03-26 NOTE — Plan of Care (Signed)
Notified by RN that pt neuro check at 17:45pm indicated a dilated and fixed left pupil, which reported at 69mm not reactive. This indicate further progression of uncal herniation and a very poor prognosis.   I called pt son and updated him about pt poor prognosis which likely will progress to brain death. I also discussed with him about DNR in the setting with severe brain herniation, he stated that he needs to discuss with his family. I offered to talk to pt wife directly but he said he will talk to his mom, and let me know.   Marvel Plan, MD PhD Stroke Neurology 03/06/2021 6:17 PM

## 2021-03-26 NOTE — Progress Notes (Signed)
Inpatient Diabetes Program Recommendations  AACE/ADA: New Consensus Statement on Inpatient Glycemic Control (2015)  Target Ranges:  Prepandial:   less than 140 mg/dL      Peak postprandial:   less than 180 mg/dL (1-2 hours)      Critically ill patients:  140 - 180 mg/dL  Results for Brendan Burgess, Brendan Burgess [facility] (MRN [id number]) as of 03/24/2021 13:11  Ref. Range 2021/04/10 23:12 03/14/2021 03:14 02/23/2021 07:58 03/08/2021 11:21  Glucose-Capillary Latest Ref Range: 70 - 99 mg/dL 144 (H)  5 units Novolog  207 (H)  5 units Novolog  200 (H)  3 units Novolog  160 (H)  3 units Novolog   Results for Brendan Burgess, Brendan Burgess [facility] (MRN [id number]) as of 03/05/2021 13:11  Ref. Range 03/07/2021 13:41  Hemoglobin A1C Latest Ref Range: 4.8 - 5.6 % 6.0 (H)   Admit with: Code Stroke  History: DM2  Home DM Meds: Amaryl 3 mg daily  Current Orders: Novolog Moderate Correction Scale/ SSI (0-15 units) Q4 hours    MD- Note patient getting Osmolite tube feeds at 60cc/hr.  CBGs >200  Please consider adding Novolog Tube Feed Coverage:  Novolog 3 units Q4 hours  HOLD if tube feeds HELD for any reason     --Will follow patient during hospitalization--  Ambrose Finland RN, MSN, CDE Diabetes Coordinator Inpatient Glycemic Control Team Team Pager: [phone] (8a-5p)

## 2021-03-26 NOTE — Progress Notes (Addendum)
PCCM:  I spoke with patients wife via interpreter at bedside.  Patient is actively dying.  I suspect he is herniating.  His blood pressure is not compatible with life.  LEVO at 60, systolics in 50s He has become bradycardic.  I explained to the wife that we would not be coding him if his heart stopped in this situation   I called and updated the stroke team Dr. Thomasena Edis  Code status changed to DNR   Josephine Igo, DO [facility] 03/08/2021 9:40 PM

## 2021-03-26 NOTE — Progress Notes (Signed)
PT Cancellation Note  Patient Details Name: Brendan Burgess MRN: [id number] DOB: 09/08/1960   Cancelled Treatment:    Reason Eval/Treat Not Completed: Other (comment) per RN, patient intubated and unresponsive with non-reactive pupils. PT will sign off. Please re-consult if appropriate.   Marigold Mom A. Dan Humphreys PT, DPT Acute Rehabilitation Services Pager [phone] Office [phone]    Viviann Spare 02/23/2021, 8:56 AM

## 2021-03-26 NOTE — Progress Notes (Signed)
RT reported to Pt room due to desat in the high 80's. After multiple attempts to raise the FiO2 it would not come up and waited with Bag valve mask in hand till RT was needed. CCM concluded that Pt was not going be able to make and said to withhold CPR id Pt went into arrest.

## 2021-03-26 NOTE — Plan of Care (Signed)
I had long discussion with son and wife at bedside, updated pt current condition, treatment plan and poor prognosis, and answered all the questions. They expressed understanding and appreciation. They are trying to get pt daughter coming over to [place] from [place] ASAP. They would like to continue current care at the meantime. I will have palliative care on board, they are in agreement.   Marvel Plan, MD PhD Stroke Neurology 03/22/2021 11:02 AM

## 2021-03-26 NOTE — Progress Notes (Signed)
STROKE TEAM PROGRESS NOTE   INTERVAL HISTORY RN is at the bedside. Pt was intubated yesterday, currently not on sedation, not responsive. Left pupil 2.31mm and right pupil 4.38mm, not reactive to light. CT yesterday showed increased hemorrhage post op and continued progressive midline shift. Poor prognosis. Will need GOC discussion with wife and son.   Vitals:   02/24/2021 0845 02/26/2021 0900 02/23/2021 0915 03/01/2021 0930  BP: (!) 104/57 (!) 109/56    Pulse: 77 78 78 85  Resp: (!) 29 (!) 30 (!) 31 (!) 27  Temp:      TempSrc:      SpO2: 100% 100% 100% 100%  Weight:      Height:       CBC:  Recent Labs  Lab 03/18/2021 1125 03/18/2021 1131 03/02/2021 0123 03/25/2021 1343 02/27/2021 0500  WBC 9.0   < > 11.5*  --  14.6*  NEUTROABS 4.3  --   --   --   --   HGB 12.4*   < > 12.5* 11.6* 13.0  HCT 38.4*   < > 38.0* 34.0* 34.8*  MCV 85.3   < > 85.0  --  88.8  PLT 389   < > 467*  --  463*   < > = values in this interval not displayed.   Basic Metabolic Panel:  Recent Labs  Lab 03/04/2021 0123 02/28/2021 1319 03/10/2021 1343 03/07/2021 1728 03/17/2021 0021 03/13/2021 0500  NA 150*   < > 154* 153* 159* 161*  K 3.5  --  3.5  --   --  4.9  CL 121*  --   --   --   --  >130*  CO2 19*  --   --   --   --  15*  GLUCOSE 181*  --   --   --   --  250*  BUN 8  --   --   --   --  13  CREATININE 0.84  --   --   --   --  1.07  CALCIUM 9.1  --   --   --   --  8.5*  MG 2.4  --   --  2.6*  --  2.5*  PHOS 1.4*  --   --  1.4*  --  1.1*   < > = values in this interval not displayed.    Lipid Panel:  Recent Labs  Lab 03/02/2021 1341 03/04/2021 0500  CHOL 127  --   TRIG 138 1,460*  HDL 45  --   CHOLHDL 2.8  --   VLDL 28  --   LDLCALC 54  --     HgbA1c:  Recent Labs  Lab 03/01/2021 1341  HGBA1C 6.0*   Urine Drug Screen:  Recent Labs  Lab 03/24/2021 1741  LABOPIA NONE DETECTED  COCAINSCRNUR NONE DETECTED  LABBENZ NONE DETECTED  AMPHETMU NONE DETECTED  THCU NONE DETECTED  LABBARB NONE DETECTED    Alcohol Level   Recent Labs  Lab 03/13/2021 1341  ETH <10    IMAGING past 24 hours CT HEAD WO CONTRAST ( )  Result Date: 03/02/2021 CLINICAL DATA:  Cerebral hemorrhage suspected EXAM: CT HEAD WITHOUT CONTRAST TECHNIQUE: Contiguous axial images were obtained from the base of the skull through the vertex without intravenous contrast. COMPARISON:  CT head from the same day. FINDINGS: Brain: Redemonstrated postoperative change with tract extending from the area parenchymal hemorrhage posteriorly acute hemorrhage in this region (for example series 3, image 19). Similar gas in  this region. The size of acute hemorrhage along this tract has mildly increased. No substantial change in large intraparenchymal hemorrhage in the left cerebral hemisphere, measuring approximately 7.0 x 4 cm (previously 7.3 x 4 cm). Similar surrounding edema and similar versus slightly increased rightward midline shift, measuring approximately 1.5 cm (previously 1.4 cm). Similar hemorrhage within the lateral left temporal lobe. Similar left cerebral convexity sulcal effacement and effacement of the suprasellar cistern. Similar intraventricular hemorrhage. Similar enlargement the right lateral ventricle with rounding of the right temporal horn, compatible with ventricular entrapment. No evidence of acute large vascular territory infarct. Similar sulcal effacement on the left. Vascular: No hyperdense vessel identified. Skull: No acute fracture.  Left posterior burr hole. Sinuses/Orbits: Inferior left maxillary sinus mucosal thickening. Other: No mastoid effusions. IMPRESSION: 1. Redemonstrated postoperative change with increased hemorrhage along the tract posterior to the dominant intraparenchymal hemorrhage (for example series 3, image 19). Otherwise, similar size of the large parenchymal hemorrhage with similar versus slightly increased rightward midline shift (1.5 cm versus 1.4 cm). 2. Persistent enlargement of the right lateral ventricle, compatible  with ventricular entrapment. Similar intraventricular hemorrhage. Electronically Signed   By: Feliberto Harts M.D.   On: 03/13/2021 14:52   CT HEAD WO CONTRAST ( )  Result Date: 03/23/2021 CLINICAL DATA:  Intracranial hemorrhage, follow-up EXAM: CT HEAD WITHOUT CONTRAST TECHNIQUE: Contiguous axial images were obtained from the base of the skull through the vertex without intravenous contrast. COMPARISON:  Earlier same day FINDINGS: Brain: New postoperative changes with tract extending to the area of parenchymal hemorrhage from a posterior approach with air and acute hemorrhage. Size of parenchymal hemorrhage remains large measuring 7.3 x 4 cm (previously 6.9 x 3.7 cm). Similar surrounding edema. Remains mass effect on the adjacent ventricles with subfalcine herniation. Rightward midline shift measures 1.4 cm (previously 1 cm). There is decreased effacement of the left lateral ventricle. Persistent trapping of the right lateral ventricle. Intraventricular hemorrhage is again noted and has increased. There is new hyperdense hemorrhage within the lateral left temporal lobe (series 3, image 15). Trace subdural hemorrhage remains present along the left parietal convexity (series 5, image 38). Persistent low density within the pons may be artifactual or reflect edema. Vascular: No new finding. Skull: New very small posterior left parietal craniotomy. Sinuses/Orbits: No acute finding. Other: None. IMPRESSION: Interval postoperative changes with new hemorrhage and air along the tract. Size of parenchymal hemorrhage remains large and measures slightly greater with similar surrounding edema. Rightward midline shift measures greater at 1.4 cm versus 1 cm with persistent trapping of the right lateral ventricle. Increased intraventricular hemorrhage. New hyperdense hemorrhage within the lateral left temporal lobe. These results will be called to the ordering clinician or representative by the Radiologist Assistant, and  communication documented in the PACS or [employer]. Electronically Signed   By: Guadlupe Spanish M.D.   On: 03/09/2021 12:50   DG Chest Port 1 View  Result Date: 03/09/2021 CLINICAL DATA:  Reason for exam: hx of ETT, code stroke. Pt non-verbal EXAM: PORTABLE CHEST - 1 VIEW COMPARISON:  03/24/2021 and previous FINDINGS: Endotracheal tube is been placed 3.2 cm above carina. Weighted tip feeding tube extends to the pylorus. Patchy right infrahilar atelectasis or infiltrate. Left lung clear. Heart size and mediastinal contours are within normal limits. Aortic Atherosclerosis (ICD10-170.0). No effusion. Visualized bones unremarkable. IMPRESSION: 1. Good position of endotracheal tube and feeding tube. 2. Mild patchy right infrahilar atelectasis or infiltrate Electronically Signed   By: Ronald Pippins.D.  On: 04-01-2021 12:47   [place] EKG SITE RITE  Result Date: 04/01/2021 If Site Rite image not attached, placement could not be confirmed due to current cardiac rhythm.   PHYSICAL EXAM  Temp:  [97.7 F (36.5 C)-103 F (39.4 C)] 100.6 F (38.1 C) (10/21 0800) Pulse Rate:  [74-137] 85 (10/21 0930) Resp:  [16-31] 27 (10/21 0930) BP: (99-175)/(54-102) 109/56 (10/21 0900) SpO2:  [97 %-100 %] 100 % (10/21 0930) Arterial Line BP: (132-230)/(49-93) 157/51 (10/21 0930) FiO2 (%):  [40 %-100 %] 40 % (10/21 0722) Weight:  [60.3 kg] 60.3 kg (10/21 0359)  General - Well nourished, well developed, intubated not on sedation.  Ophthalmologic - fundi not visualized due to noncooperation.  Cardiovascular - Regular rate and rhythm.  Neuro - intubated not on sedation, eyes closed, not following commands. With forced eye opening, eyes in mid position, not blinking to visual threat, doll's eyes present, not tracking, left pupil 2.83mm and right pupil 4.58mm, not reactive to light. Corneal reflex absent on the right but very weak and intermittent on the left, gag and cough present. Breathing over the vent.  Facial  symmetry not able to test due to ET tube.  Tongue protrusion not cooperative. On pain stimulation, not moving all extremities. DTR diminished and no babinski. Sensation, coordination and gait not tested.   ASSESSMENT/PLAN Brendan Burgess is a 60 y.o. male with history of HTN and ICH approximately 11 years ago while in [place] (had left sided weakness and required PT), presenting acutely from home via EMS as a Code Stroke after family noted him to be acutely nonverbal with right sided weakness.   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 Minimal amount of subarachnoid hemorrhage on the left. 3 mm of midline shift.  CT repeat x 2 showed progressive cerebral edema 62mm MLS MRI large, estimated 57 mL intra-axial hemorrhage centered at the left basal ganglia. Regional edema and mass effect. Subdural versus Subarachnoid hematoma. midline shift up to 10 mm.  Positive also for two punctate acute lacunar infarcts in the left superior frontal lobe. 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 CT head 10/20 x 2 s/p evacuation of left BG ICH but with likely re-bleeding, continued significant MLS and slightly increased IVH at left lateral ventricle and aqueduct 2D Echo EF 65-70% LDL 54 HgbA1c 6.0 VTE prophylaxis - scd aspirin 81 mg daily and clopidogrel 75 mg daily prior to admission, now on No antithrombotic due to IPH Therapy recommendations:  pending Disposition: Poor prognosis, will need GOC discussion with family.  Cerebral edema Brain herniation  CT head with 3 mm of midline shift.  CT repeat x 2 showed progressive cerebral edema 26mm MLS MRI large, estimated 57 mL intra-axial hemorrhage centered at the left basal ganglia with midline shift up to 10 mm.  On 3% saline  @50 ->75->off Neurosurgery on board Na 136->138->141->147->150->159->161 Na goal 150-155 CT head 10/20 x 2 s/p evacuation of left BG ICH but with likely re-bleeding, continued significant MLS and slightly increased IVH at left lateral ventricle and aqueduct Anisocoria R>L, fixed Poor prognosis, will need GOC discussion with family.  Hypertensive emergency Home meds:  atenolol, micardis Unstable On cleviprex on home atenolol and avapro BP goal < 160 Long-term BP goal normotensive  Hyperlipidemia Home meds:  crestor  LDL 54, goal < 70 Hold off statin for now due to ICH Consider to resume statin at discharge  Dysphagia N.p.o. On core track On TF @60   Other Stroke Risk Factors  hypertension  Hx ICH at right BG 11 years ago with mild residue left weakness   Other Active Problems  Leukocytosis WBC 9.0->14.8->9.6->11.5->14.6  Hospital day # 3  This patient is critically ill due to large right BG ICH, rebleed after evacuation, IVH, cerebral edema, hydrocephalus and at significant risk of neurological worsening, death form brain herniation, brain death, seizure, obstructive hydrocephalus. This patient's care requires constant monitoring of vital signs, hemodynamics, respiratory and cardiac monitoring, review of multiple databases, neurological assessment, discussion with family, other specialists and medical decision making of high complexity. I spent 45 minutes of neurocritical care time in the care of this patient. I also discussed with Dr. , MD PhD Stroke Neurology March 28, 2021 10:11 AM    To contact Stroke Continuity provider, please refer to 03/17/2021. After hours, contact General Neurology

## 2021-03-26 NOTE — Progress Notes (Signed)
Speech Language Pathology Discharge Patient Details Name: Brendan Burgess MRN: [id number] DOB: 1960-06-16 Today's Date: 03/09/2021 Time:  -     Patient discharged from SLP services secondary to medical decline - will need to re-order SLP to resume therapy services. Intubated/sedated  Please see latest therapy progress note for current level of functioning and progress toward goals.    Progress and discharge plan discussed with patient and/or caregiver: Patient unable to participate in discharge planning and no caregivers available  GO     Royce Macadamia 03/10/2021, 5:16 PM

## 2021-03-26 NOTE — Progress Notes (Signed)
eLink Physician-Brief Progress Note Patient Name: Brendan Burgess DOB: 1960/07/21 MRN: [id number]   Date of Service  03/12/2021  HPI/Events of Note  Multiple issues: 1. Hypotension - BP = 67/46 on Norepinephrine IV infusion at 40 mcg/min. 2. Hypoxia - Sat = 90% on 50% FIO2. Portable CXR with increasing infiltrate in R lung.   eICU Interventions  Plan: Increase PEEP to 10. Increase ceiling on Norepinephrine iV infusion to 60 mcg/min. ABG STAT. PCCM ground team asked to evaluate the patient at bedside.      Intervention Category Major Interventions: Hypotension - evaluation and management;Hypoxemia - evaluation and management  Lenell Antu 02/26/2021, 9:32 PM
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
## 2021-03-26 NOTE — Progress Notes (Signed)
Occupational Therapy Discharge Patient Details Name: Brendan Burgess MRN: [id number] DOB: [birth year] Today's Date: 03/01/2021 Time:  -     Patient discharged from OT services secondary to medical decline - will need to re-order OT to resume therapy services (intubated and sedated).  Please see latest therapy progress note for current level of functioning and progress toward goals.    Progress and discharge plan discussed with patient and/or caregiver: Patient unable to participate in discharge planning and no caregivers available  GO     Wynona Neat, OTR/L  Acute Rehabilitation Services Pager: [phone] Office: [phone] .  03/20/2021, 9:44 AM

## 2021-03-26 DEATH — deceased

## 2022-10-18 IMAGING — CT CT HEAD W/O CM
3 series · 15 of 47 positions shown, 18 images · non-contrast
Comparison: CT head from the same day.

CLINICAL DATA: Cerebral hemorrhage suspected

EXAM:
CT HEAD WITHOUT CONTRAST
TECHNIQUE: Contiguous axial images were obtained from the base of the skull
through the vertex without intravenous contrast.

[Series 3: head 5.0 h30s · axial · 0.40mm/px · z∈[+1341,+1476]mm · 9 of 33 slices shown, 12 images]
[im 3/33  brain]
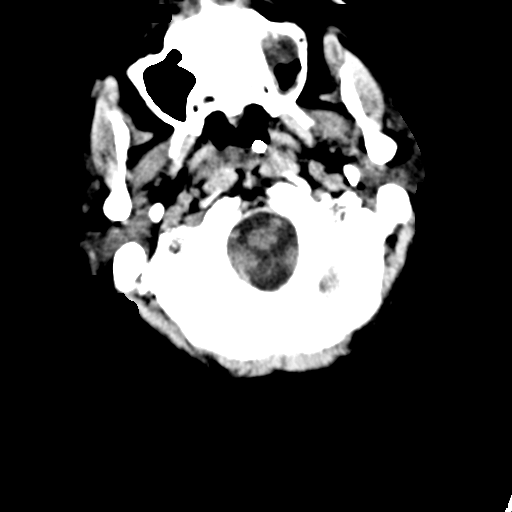
[im 3/33  bone]
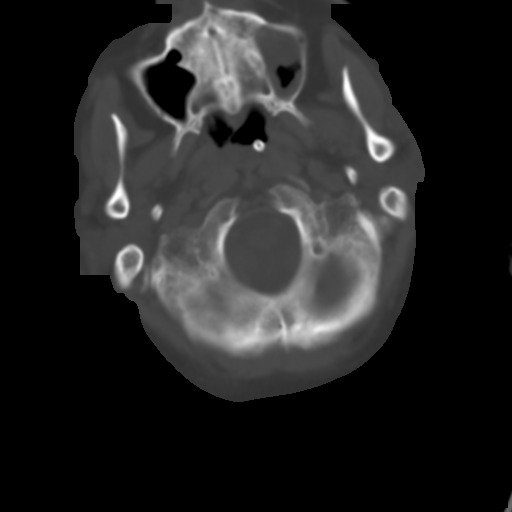
[im 6/33  brain]
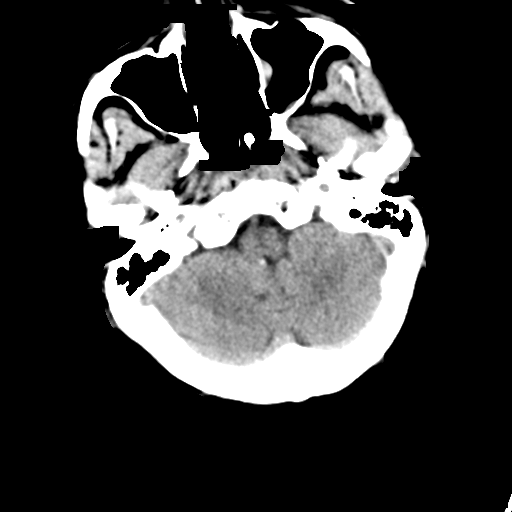
[im 9/33  brain]
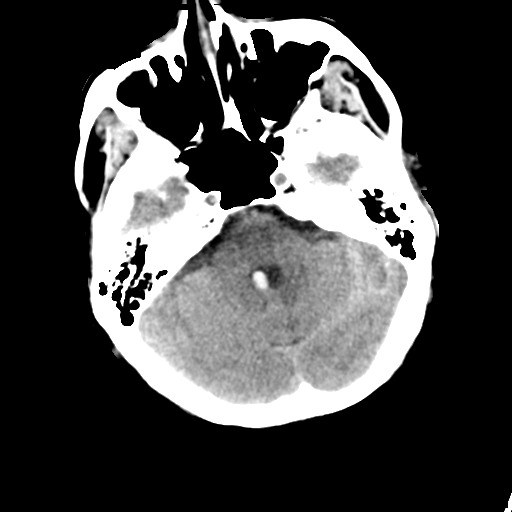
[im 13/33  brain]
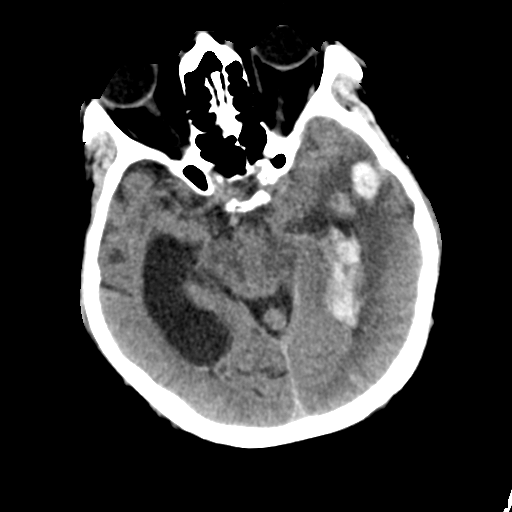
[im 17/33  brain]
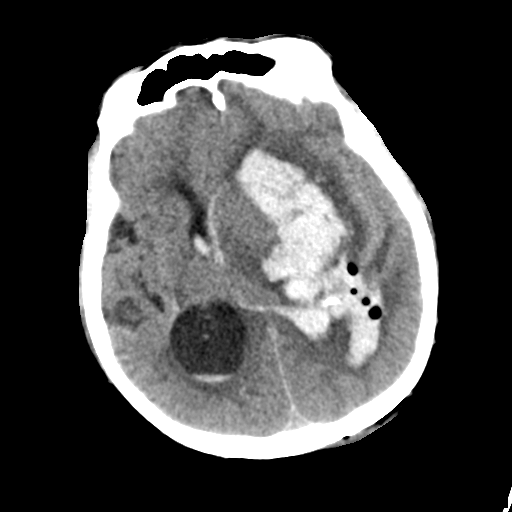
[im 17/33  bone]
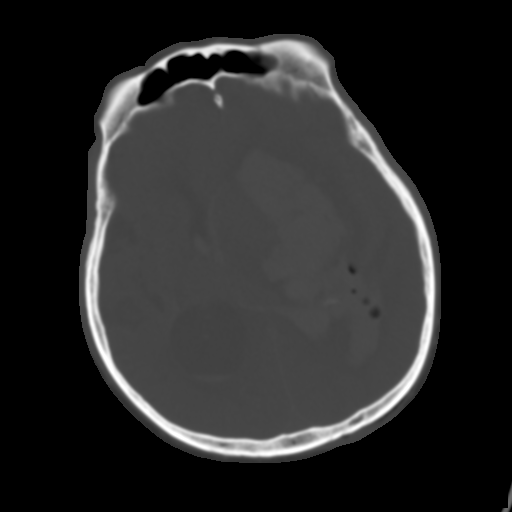
[im 20/33  brain]
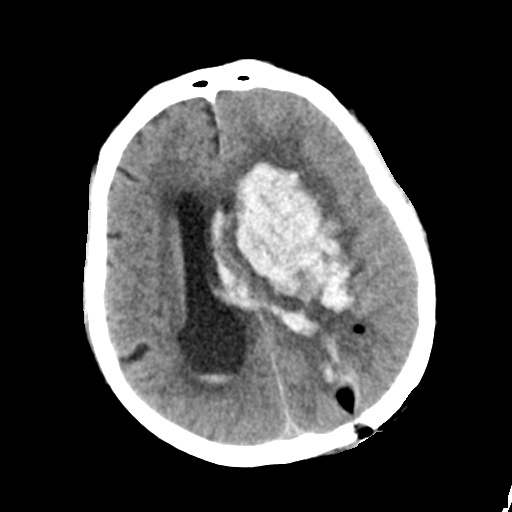
[im 24/33  brain]
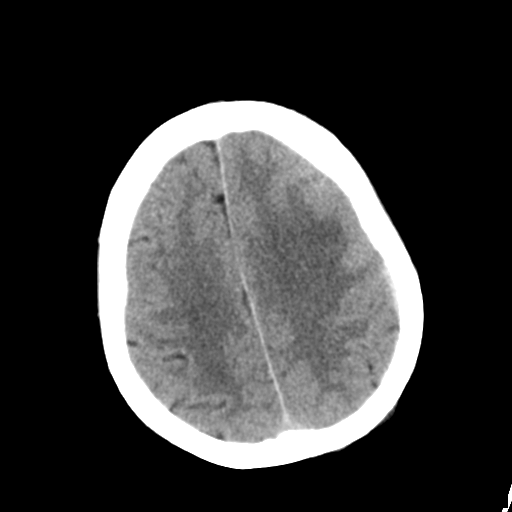
[im 27/33  brain]
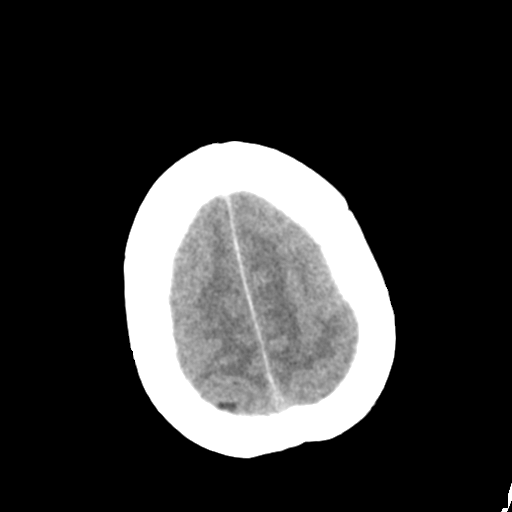
[im 30/33  brain]
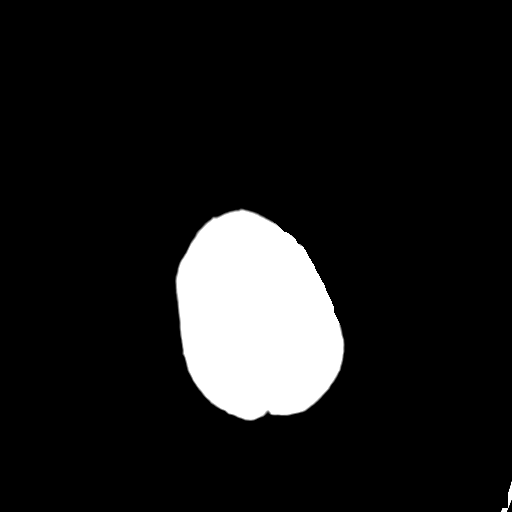
[im 30/33  bone]
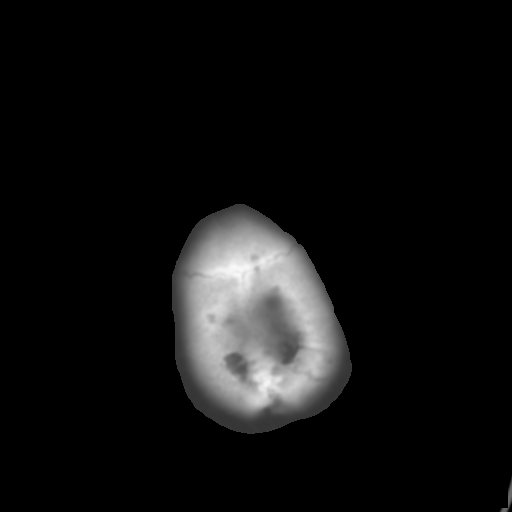

[Series 5: head 3.0 mpr cor · coronal · 0.37mm/px · 3 of 59 slices shown]
[im 20/59  brain]
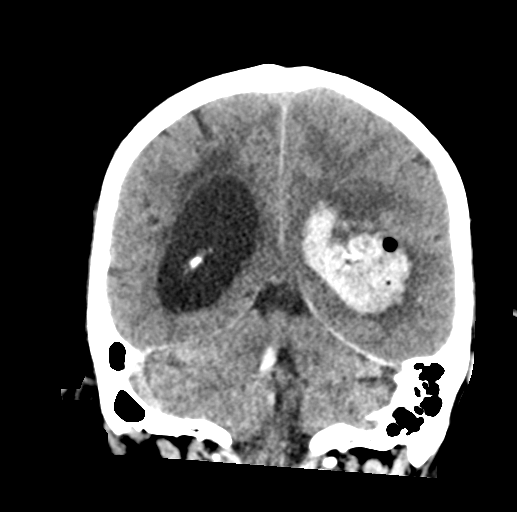
[im 26/59  brain]
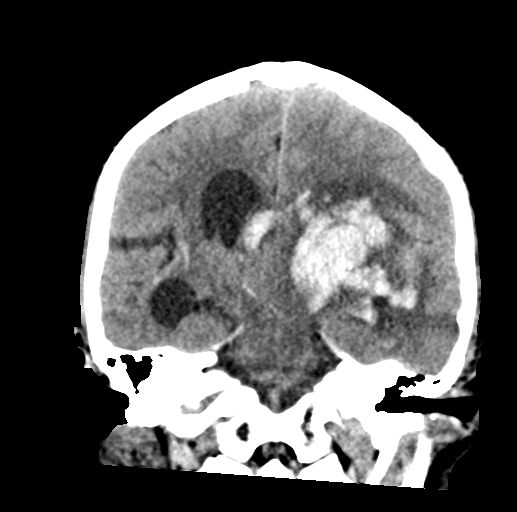
[im 33/59  brain]
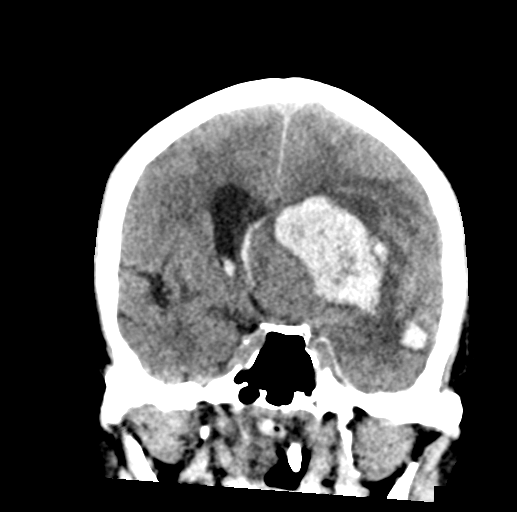

[Series 6: head 3.0 mpr sag · sagittal · 0.34mm/px · 3 of 55 slices shown]
[im 19/55  brain]
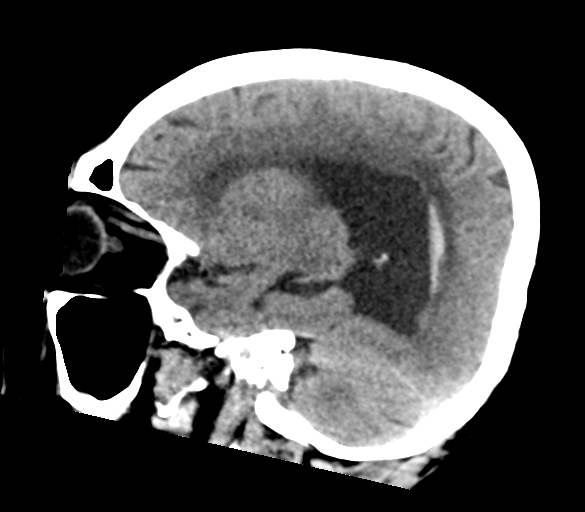
[im 28/55  brain]
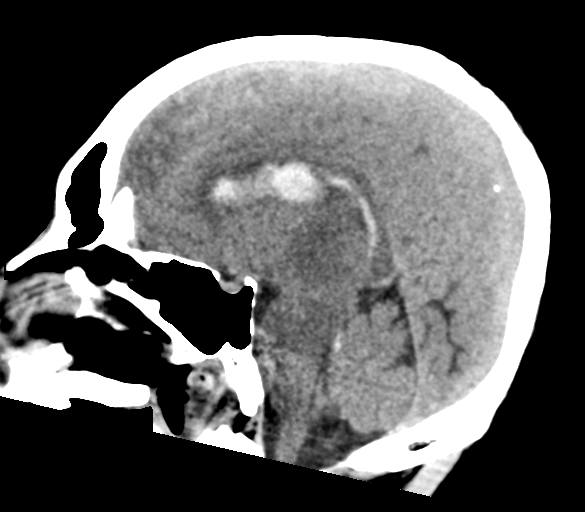
[im 37/55  brain]
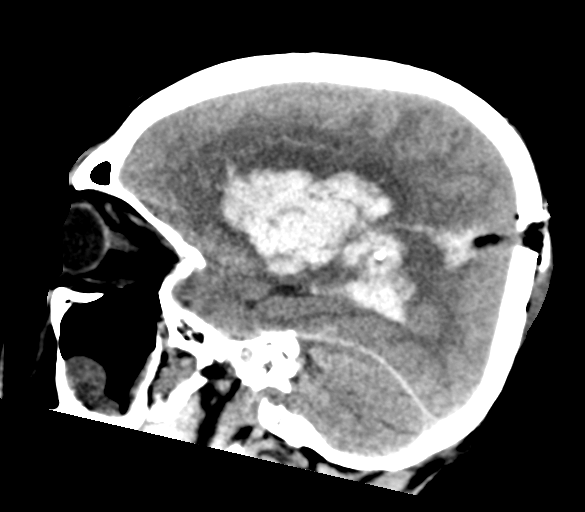

[15 of 47 positions shown; findings below may reference images not displayed]

FINDINGS: Brain: Redemonstrated postoperative change with tract extending from
the area parenchymal hemorrhage posteriorly acute hemorrhage in this
region (for example series 3, image 19). Similar gas in this region.
The size of acute hemorrhage along this tract has mildly increased.
No substantial change in large intraparenchymal hemorrhage in the
left cerebral hemisphere, measuring approximately 7.0 x 4 cm
(previously 7.3 x 4 cm). Similar surrounding edema and similar
versus slightly increased rightward midline shift, measuring
approximately 1.5 cm (previously 1.4 cm). Similar hemorrhage within
the lateral left temporal lobe. Similar left cerebral convexity
sulcal effacement and effacement of the suprasellar cistern. Similar
intraventricular hemorrhage. Similar enlargement the right lateral
ventricle with rounding of the right temporal horn, compatible with
ventricular entrapment. No evidence of acute large vascular
territory infarct. Similar sulcal effacement on the left.

Vascular: No hyperdense vessel identified.

Skull: No acute fracture.  Left posterior burr hole.

Sinuses/Orbits: Inferior left maxillary sinus mucosal thickening.

Other: No mastoid effusions.
IMPRESSION: 1. Redemonstrated postoperative change with increased hemorrhage
along the tract posterior to the dominant intraparenchymal
hemorrhage (for example series 3, image 19). Otherwise, similar size
of the large parenchymal hemorrhage with similar versus slightly
increased rightward midline shift (1.5 cm versus 1.4 cm).
2. Persistent enlargement of the right lateral ventricle, compatible
with ventricular entrapment. Similar intraventricular hemorrhage.

## 2022-10-19 IMAGING — DX DG CHEST 1V PORT
1 series · 1 of 1 positions shown · non-contrast
Comparison: 03/14/2021

CLINICAL DATA: Hypoxia, intubated

EXAM:
PORTABLE CHEST 1 VIEW

[chest]
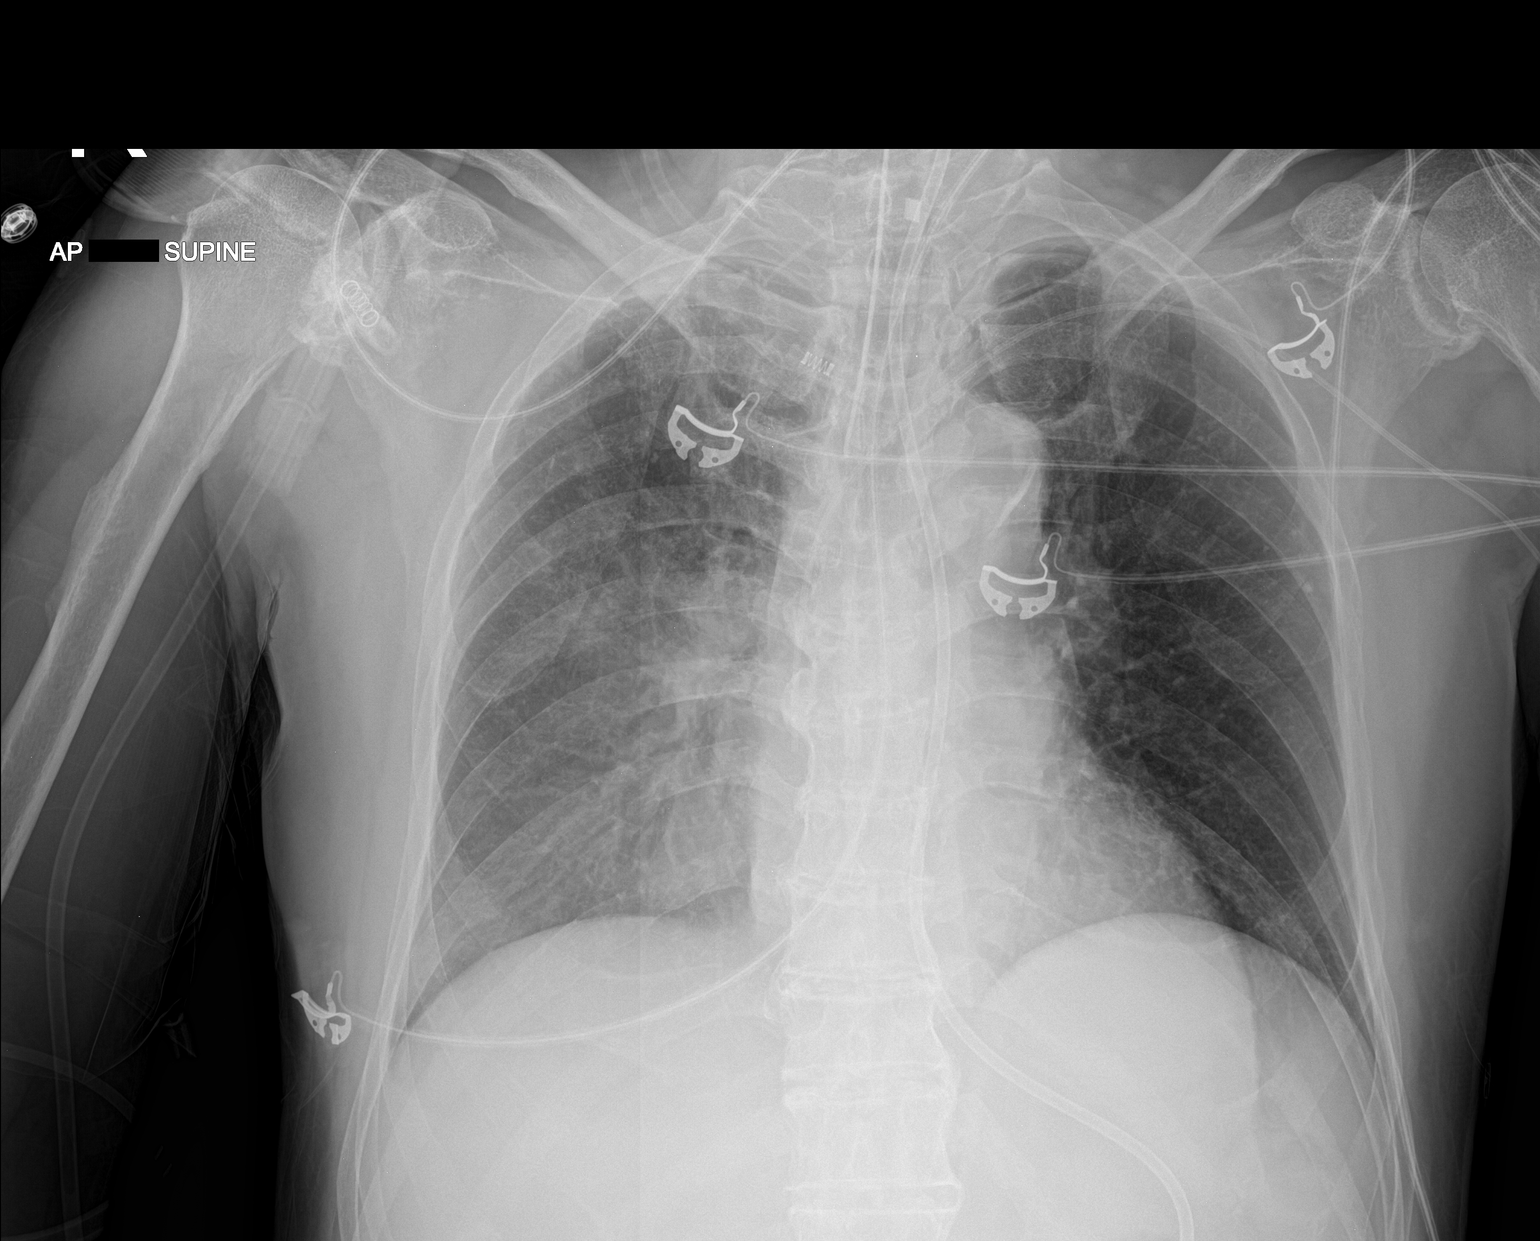

[1 of 1 positions shown; findings below may reference images not displayed]

FINDINGS: Single frontal view of the chest demonstrates endotracheal tube
overlying tracheal air column, tip approximate 2.3 cm above carina.
Enteric catheter passes below diaphragm tip excluded by collimation.
Left-sided PICC tip overlies the superior vena cava. The cardiac
silhouette is unremarkable. There is improved aeration at the medial
right lung base, with persistent hazy airspace disease. No effusion
or pneumothorax. No acute bony abnormalities.
IMPRESSION: 1. Improved aeration at the right lung base, with persistent right
basilar consolidation consistent with resolving atelectasis or
airspace disease.
2. Support devices as above.
